# Patient Record
Sex: Male | Born: 1945 | Race: White | Hispanic: No | Marital: Married | Smoking: Never smoker
Health system: Southern US, Community
[De-identification: ages and names within clinical notes are randomized; demographics above are authoritative.]

## PROBLEM LIST (undated history)

## (undated) DIAGNOSIS — R0600 Dyspnea, unspecified: Secondary | ICD-10-CM

## (undated) DIAGNOSIS — C61 Malignant neoplasm of prostate: Secondary | ICD-10-CM

## (undated) DIAGNOSIS — G2581 Restless legs syndrome: Secondary | ICD-10-CM

## (undated) DIAGNOSIS — K219 Gastro-esophageal reflux disease without esophagitis: Secondary | ICD-10-CM

## (undated) DIAGNOSIS — E119 Type 2 diabetes mellitus without complications: Secondary | ICD-10-CM

## (undated) DIAGNOSIS — M1990 Unspecified osteoarthritis, unspecified site: Secondary | ICD-10-CM

## (undated) HISTORY — PX: HERNIA REPAIR: SHX51

## (undated) HISTORY — PX: PROSTATE BIOPSY: SHX241

## (undated) HISTORY — PX: COLONOSCOPY: SHX174

---

## 2015-11-25 ENCOUNTER — Telehealth: Payer: Self-pay | Admitting: Medical Oncology

## 2015-11-27 NOTE — Telephone Encounter (Signed)
Oncology Nurse Navigator Documentation  Oncology Nurse Navigator Flowsheets 11/27/2015  Referral date to RadOnc/MedOnc 11/22/2015  Navigator Encounter Type Introductory phone call- Left a message requesting a return call to discuss appointment for Prostate [facility].

## 2015-12-10 ENCOUNTER — Ambulatory Visit: Payer: Self-pay | Admitting: Radiation Oncology

## 2015-12-10 ENCOUNTER — Ambulatory Visit: Payer: Self-pay | Admitting: Oncology

## 2015-12-19 ENCOUNTER — Encounter: Payer: Self-pay | Admitting: Medical Oncology

## 2015-12-19 ENCOUNTER — Telehealth: Payer: Self-pay | Admitting: Medical Oncology

## 2015-12-19 NOTE — Progress Notes (Signed)
Oncology Nurse Navigator Documentation  Oncology Nurse Navigator Flowsheets 11/27/2015 12/19/2015  Navigator Encounter Type Introductory phone call Telephone- I called [facility],spoke with [place] request  Pathology slides for Prostate [facility] 12/17/15.  Telephone - Outgoing Call  Barriers/Navigation Needs - Coordination of Care  Interventions - Other;Coordination of Care

## 2015-12-19 NOTE — Telephone Encounter (Signed)
error 

## 2015-12-19 NOTE — Telephone Encounter (Signed)
Oncology Nurse Navigator Documentation  Oncology Nurse Navigator Flowsheets 11/27/2015 12/10/2015 12/19/2015  Navigator Encounter Type Introductory phone call Telephone Telephone  Telephone - Appt Confirmation/Clarification Pt returned my call to confirm appointment. He will not be able to come 12/27 and requested a January appointment. He was given 12/27/15 arriving at 7:45am.   Outgoing Call  Barriers/Navigation Needs - - Coordination of Care  Interventions - - Other;Coordination of Care  Time Spent with Patient - 15 -   I called pt to introduce myself as the Prostate Nurse Navigator and the Coordinator of the Prostate [place].  1. I confirmed with the patient he is aware of his referral to the clinic 12/27/15.  2. I discussed the format of the clinic and the physicians he will be seeing that day.  3. I discussed where the clinic is located and how to contact me.  4. I confirmed his address and informed him I would be mailing a packet of information and forms to be completed. I asked him to bring them with him the day of his appointment.   He voiced understanding of the above. I asked him to call me if he has any questions or concerns regarding his appointments or the forms he needs to complete.

## 2015-12-27 ENCOUNTER — Ambulatory Visit: Payer: Self-pay | Admitting: Radiation Oncology

## 2015-12-27 ENCOUNTER — Ambulatory Visit: Payer: Self-pay | Admitting: Oncology

## 2016-01-01 ENCOUNTER — Encounter: Payer: Self-pay | Admitting: Adult Health

## 2016-01-01 DIAGNOSIS — C61 Malignant neoplasm of prostate: Secondary | ICD-10-CM | POA: Insufficient documentation

## 2016-01-06 ENCOUNTER — Encounter: Payer: Self-pay | Admitting: Radiation Oncology

## 2016-01-06 ENCOUNTER — Telehealth: Payer: Self-pay | Admitting: Medical Oncology

## 2016-01-06 NOTE — Progress Notes (Signed)
GU Location of Tumor / Histology: prostatic adenocarcinoma  If Prostate Cancer, Gleason Score is (3 + 3) and PSA is (5.54) on 10/29/2015  Alan Charles presented to Dr. Alinda Money for a second opinion regarding his elevated PSA and LUTS.  Biopsies of prostate (if applicable) revealed:    Past/Anticipated interventions by urology, if any: Patient was followed by Dr. Darcella Gasman, urologist, for elevated PSA from 2013 until August 2014 when a prostate biopsy was recommended. Patient elected not to have biopsy and had not been seen by a urologist until he saw Dr. Alinda Money on 09/06/2015. However, patient had a PSA drawn by  PCP November 2015 and PSA had risen. Prostate biopsy was performed by Dr. Alinda Money then, referral to [facility] was made.  Past/Anticipated interventions by medical oncology, if any: no  Weight changes, if any: no  Bowel/Bladder complaints, if any: incomplete emptying, frequency, urgency and nocturia.    Nausea/Vomiting, if any: no  Pain issues, if any:  no  SAFETY ISSUES:  Prior radiation? no  Pacemaker/ICD? no  Possible current pregnancy? no  Is the patient on methotrexate? no  Current Complaints / other details:  70 year old male. Married.

## 2016-01-06 NOTE — Telephone Encounter (Signed)
Oncology Nurse Navigator Documentation  Oncology Nurse Navigator Flowsheets 12/10/2015 12/19/2015 01/06/2016  Navigator Encounter Type Telephone Telephone Telephone  Telephone Appt Confirmation/Clarification Outgoing Call Outgoing Call;Appt Confirmation/Clarification- Spoke with Alan Charles to confirm his appointment for 01/07/16 arriving at 12:30pm.I reminded him to bring his completed medical forms and to eat lunch before his arrival to the clinic. We reviewed the location of the clinic and the [place] parking. He asked me how much money he will have to pay. I explained I do not know anything about the cost but it will be filed with his insurance. I asked if he would like for me to have some one contact him from billing and he states no. All questions were answered and he voiced understanding of the above.  Abnormal Finding Date - - 10/29/2015  Confirmed Diagnosis Date - - 11/14/2015  Barriers/Navigation Needs - Coordination of Care No barriers at this time  Interventions - Other;Coordination of Care -  Acuity - - Level 1  Acuity Level 1 - - Initial guidance, education and coordination as needed  Time Spent with Patient 15 - 15

## 2016-01-07 ENCOUNTER — Ambulatory Visit: Payer: Self-pay | Admitting: Oncology

## 2016-01-07 ENCOUNTER — Ambulatory Visit: Payer: Medicare Other | Attending: Radiation Oncology | Admitting: Radiation Oncology

## 2016-01-07 ENCOUNTER — Ambulatory Visit: Payer: Self-pay | Admitting: Radiation Oncology

## 2016-01-07 ENCOUNTER — Inpatient Hospital Stay
Admission: RE | Admit: 2016-01-07 | Discharge: 2016-01-07 | Disposition: A | Discharge status: Home / Self Care | Payer: Self-pay | Source: Ambulatory Visit | Attending: Radiation Oncology | Admitting: Radiation Oncology

## 2016-01-07 ENCOUNTER — Ambulatory Visit
Admission: RE | Admit: 2016-01-07 | Discharge: 2016-01-07 | Disposition: A | Discharge status: Home / Self Care | Payer: Self-pay | Source: Ambulatory Visit | Attending: Radiation Oncology | Admitting: Radiation Oncology

## 2016-01-07 ENCOUNTER — Ambulatory Visit
Admission: RE | Admit: 2016-01-07 | Discharge: 2016-01-07 | Disposition: A | Discharge status: Home / Self Care | Payer: Medicare Other | Source: Ambulatory Visit | Attending: Radiation Oncology | Admitting: Radiation Oncology

## 2016-01-07 HISTORY — DX: Malignant neoplasm of prostate: C61

## 2016-01-07 HISTORY — DX: Restless legs syndrome: G25.81

## 2016-01-07 HISTORY — DX: Gastro-esophageal reflux disease without esophagitis: K21.9

## 2016-01-07 HISTORY — DX: Unspecified osteoarthritis, unspecified site: M19.90

## 2016-02-11 ENCOUNTER — Ambulatory Visit: Payer: Medicare Other | Admitting: Radiation Oncology

## 2016-03-19 DIAGNOSIS — R6889 Other general symptoms and signs: Secondary | ICD-10-CM | POA: Insufficient documentation

## 2016-03-19 DIAGNOSIS — R0602 Shortness of breath: Secondary | ICD-10-CM | POA: Insufficient documentation

## 2016-04-15 ENCOUNTER — Other Ambulatory Visit (HOSPITAL_COMMUNITY): Payer: Self-pay | Admitting: Urology

## 2016-04-15 DIAGNOSIS — C61 Malignant neoplasm of prostate: Secondary | ICD-10-CM

## 2016-06-09 ENCOUNTER — Other Ambulatory Visit (HOSPITAL_COMMUNITY): Payer: Self-pay | Admitting: Urology

## 2016-06-09 ENCOUNTER — Ambulatory Visit (HOSPITAL_COMMUNITY): Admission: RE | Admit: 2016-06-09 | Payer: Medicare Other | Source: Ambulatory Visit

## 2016-06-09 ENCOUNTER — Ambulatory Visit (HOSPITAL_COMMUNITY)
Admission: RE | Admit: 2016-06-09 | Discharge: 2016-06-09 | Disposition: A | Discharge status: Home / Self Care | Payer: Medicare Other | Source: Ambulatory Visit | Attending: Urology | Admitting: Urology

## 2016-06-09 DIAGNOSIS — C61 Malignant neoplasm of prostate: Secondary | ICD-10-CM

## 2016-06-11 ENCOUNTER — Ambulatory Visit
Admission: RE | Admit: 2016-06-11 | Discharge: 2016-06-11 | Disposition: A | Discharge status: Home / Self Care | Payer: Medicare Other | Source: Ambulatory Visit | Attending: Urology | Admitting: Urology

## 2016-06-11 DIAGNOSIS — C61 Malignant neoplasm of prostate: Secondary | ICD-10-CM

## 2016-06-11 MED ORDER — GADOBENATE DIMEGLUMINE 529 MG/ML IV SOLN
20.0000 mL | Freq: Once | INTRAVENOUS | Status: AC | PRN
  Administered 2016-06-11: 20 mL via INTRAVENOUS

## 2018-01-09 IMAGING — MR MR PROSTATE WO/W CM
49 series · 49 of 49 positions shown · IV contrast (Multihance 20cc)
Comparison: None.

CLINICAL DATA: Prostate carcinoma. Positive biopsy in the LEFT
thigh apex with Gleason score 3+ 3 equals 6. Positive biopsy in the
RIGHT apex Gleason 3+ 3 equals 6.

EXAM:
MR PROSTATE WITHOUT AND WITH CONTRAST
TECHNIQUE: Multiplanar multisequence MRI images were obtained of the pelvis
centered about the prostate. Pre and post contrast images were
obtained. No endo rectal coil deployment due to patient latex
allergy.
CONTRAST:  20mL MULTIHANCE GADOBENATE DIMEGLUMINE 529 MG/ML IV SOLN

[Series 3: T1 · axial · 8.0mm · 1.06mm/px · 1 of 28 slices shown (1 of 2)]
[im 1/28]
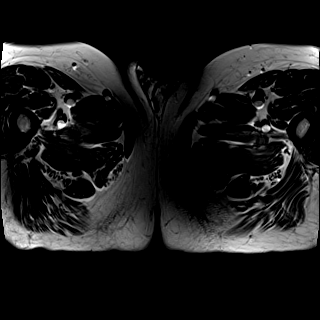

[Series 4: T2 · sagittal · 3.5mm · 0.56mm/px · 1 of 20 slices shown (1 of 3)]
[im 1/20]
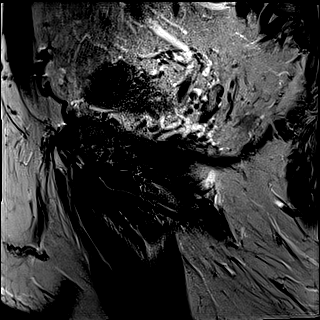

[Series 5: bSSFP fat-sat · axial · 5.0mm · 0.74mm/px · 1 of 40 slices shown]
[im 1/40]
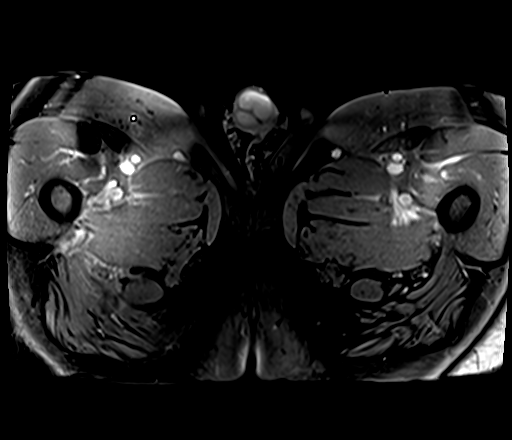

[Series 6: T1 · axial · 3.0mm · 0.31mm/px · 1 of 24 slices shown (2 of 2)]
[im 1/24]
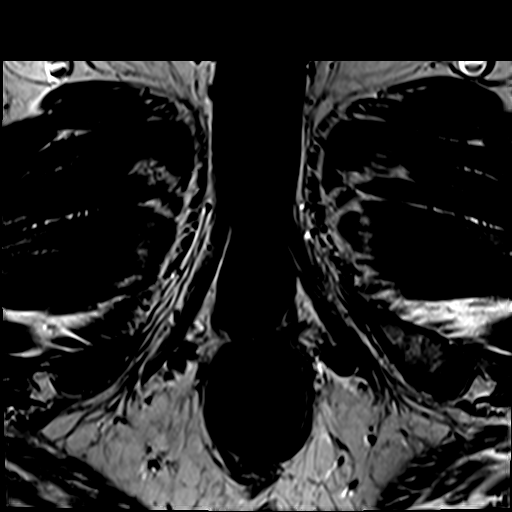

[Series 7: T2 · axial · 3.5mm · 0.56mm/px · 1 of 20 slices shown (2 of 3)]
[im 1/20]
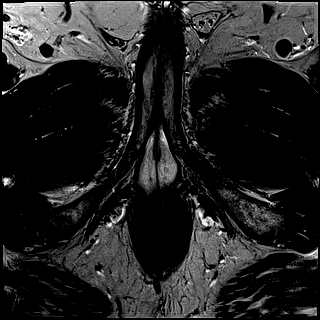

[Series 8: T2 · coronal · 3.5mm · 0.56mm/px · 1 of 20 slices shown (3 of 3)]
[im 1/20]
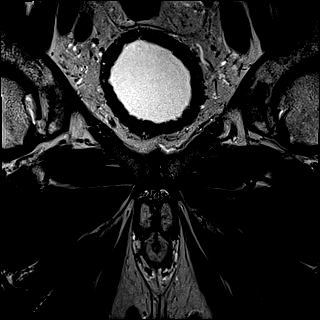

[Series 9: DWI · axial · 3.5mm · 1.72mm/px · 1 of 59 slices shown (1 of 2)]
[im 1/59]
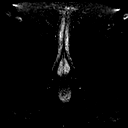

[Series 10: DWI · axial · 3.5mm · 1.72mm/px · 1 of 20 slices shown (2 of 2)]
[im 1/20]
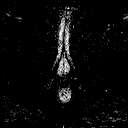

[Series 11: t1_twist_tra_dyn_ttc=5.3s · axial · 3.5mm · 0.83mm/px · 1 of 20 slices shown]
[im 1/20]
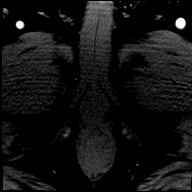

[Series 12: t1_twist_tra_dyn_ttc=12.8s · axial · 3.5mm · 0.83mm/px · 1 of 20 slices shown]
[im 1/20]
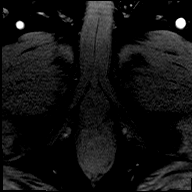

[Series 13: t1_twist_tra_dyn_ttc=23.0s · axial · 3.5mm · 0.83mm/px · 1 of 20 slices shown]
[im 1/20]
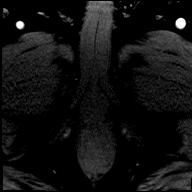

[Series 14: t1_twist_tra_dyn_ttc=33.2s · axial · 3.5mm · 0.83mm/px · 1 of 20 slices shown]
[im 1/20]
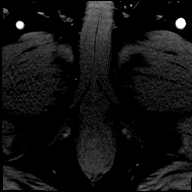

[Series 15: t1_twist_tra_dyn_ttc=43.4s · axial · 3.5mm · 0.83mm/px · 1 of 20 slices shown]
[im 1/20]
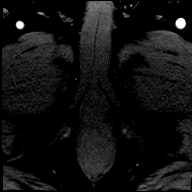

[Series 16: t1_twist_tra_dyn_ttc=53.6s · axial · 3.5mm · 0.83mm/px · 1 of 20 slices shown]
[im 1/20]
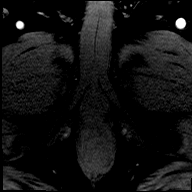

[Series 17: t1_twist_tra_dyn_ttc=63.8s · axial · 3.5mm · 0.83mm/px · 1 of 20 slices shown]
[im 1/20]
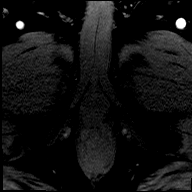

[Series 18: t1_twist_tra_dyn_ttc=74.0s · axial · 3.5mm · 0.83mm/px · 1 of 20 slices shown]
[im 1/20]
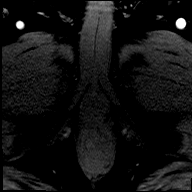

[Series 19: t1_twist_tra_dyn_ttc=84.2s · axial · 3.5mm · 0.83mm/px · 1 of 20 slices shown]
[im 1/20]
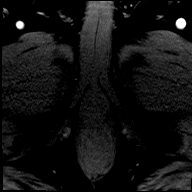

[Series 20: t1_twist_tra_dyn_ttc=94.4s · axial · 3.5mm · 0.83mm/px · 1 of 20 slices shown]
[im 1/20]
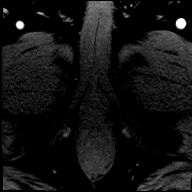

[Series 21: t1_twist_tra_dyn_ttc=104.7s · axial · 3.5mm · 0.83mm/px · 1 of 20 slices shown]
[im 1/20]
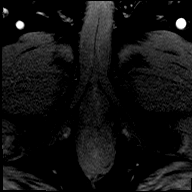

[Series 22: t1_twist_tra_dyn_ttc=114.9s · axial · 3.5mm · 0.83mm/px · 1 of 20 slices shown]
[im 1/20]
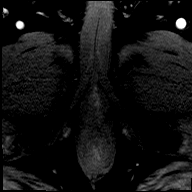

[Series 23: t1_twist_tra_dyn_ttc=125.1s · axial · 3.5mm · 0.83mm/px · 1 of 20 slices shown]
[im 1/20]
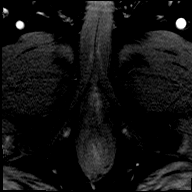

[Series 24: t1_twist_tra_dyn_ttc=135.3s · axial · 3.5mm · 0.83mm/px · 1 of 20 slices shown]
[im 1/20]
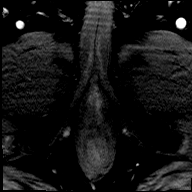

[Series 25: t1_twist_tra_dyn_ttc=145.5s · axial · 3.5mm · 0.83mm/px · 1 of 20 slices shown]
[im 1/20]
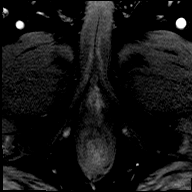

[Series 26: t1_twist_tra_dyn_ttc=155.7s · axial · 3.5mm · 0.83mm/px · 1 of 20 slices shown]
[im 1/20]
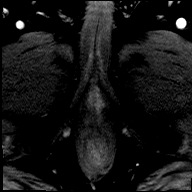

[Series 27: t1_twist_tra_dyn_ttc=165.9s · axial · 3.5mm · 0.83mm/px · 1 of 20 slices shown]
[im 1/20]
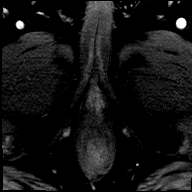

[Series 28: t1_twist_tra_dyn_ttc=176.1s · axial · 3.5mm · 0.83mm/px · 1 of 20 slices shown]
[im 1/20]
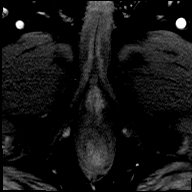

[Series 29: t1_twist_tra_dyn_ttc=186.3s · axial · 3.5mm · 0.83mm/px · 1 of 20 slices shown]
[im 1/20]
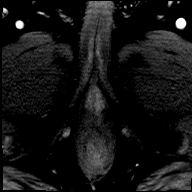

[Series 30: t1_twist_tra_dyn_ttc=196.5s · axial · 3.5mm · 0.83mm/px · 1 of 20 slices shown]
[im 1/20]
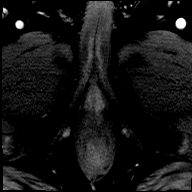

[Series 31: t1_twist_tra_dyn_ttc=206.7s · axial · 3.5mm · 0.83mm/px · 1 of 20 slices shown]
[im 1/20]
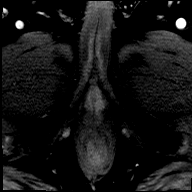

[Series 32: t1_twist_tra_dyn_ttc=216.9s · axial · 3.5mm · 0.83mm/px · 1 of 20 slices shown]
[im 1/20]
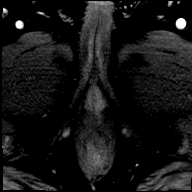

[Series 33: t1_twist_tra_dyn_ttc=227.1s · axial · 3.5mm · 0.83mm/px · 1 of 20 slices shown]
[im 1/20]
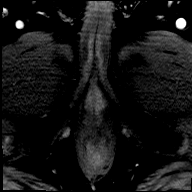

[Series 34: t1_twist_tra_dyn_ttc=237.3s · axial · 3.5mm · 0.83mm/px · 1 of 20 slices shown]
[im 1/20]
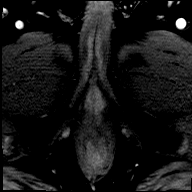

[Series 35: t1_twist_tra_dyn_ttc=247.5s · axial · 3.5mm · 0.83mm/px · 1 of 20 slices shown]
[im 1/20]
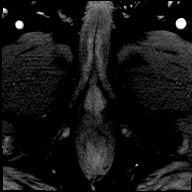

[Series 36: t1_twist_tra_dyn_ttc=257.7s · axial · 3.5mm · 0.83mm/px · 1 of 20 slices shown]
[im 1/20]
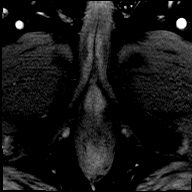

[Series 37: t1_twist_tra_dyn_ttc=267.9s · axial · 3.5mm · 0.83mm/px · 1 of 20 slices shown]
[im 1/20]
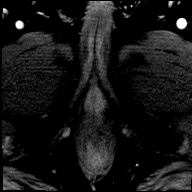

[Series 38: t1_twist_tra_dyn_ttc=278.1s · axial · 3.5mm · 0.83mm/px · 1 of 20 slices shown]
[im 1/20]
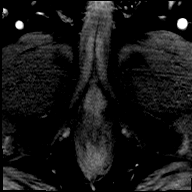

[Series 39: t1_twist_tra_dyn_ttc=288.3s · axial · 3.5mm · 0.83mm/px · 1 of 20 slices shown]
[im 1/20]
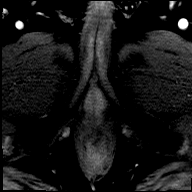

[Series 40: t1_twist_tra_dyn_ttc=298.5s · axial · 3.5mm · 0.83mm/px · 1 of 20 slices shown]
[im 1/20]
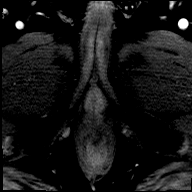

[Series 41: t1_twist_tra_dyn_ttc=308.7s · axial · 3.5mm · 0.83mm/px · 1 of 20 slices shown]
[im 1/20]
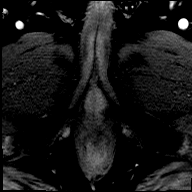

[Series 42: t1_twist_tra_dyn_ttc=318.9s · axial · 3.5mm · 0.83mm/px · 1 of 20 slices shown]
[im 1/20]
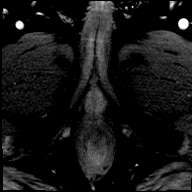

[Series 43: t1_twist_tra_dyn_ttc=329.1s · axial · 3.5mm · 0.83mm/px · 1 of 20 slices shown]
[im 1/20]
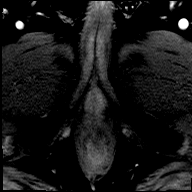

[Series 44: t1_twist_tra_dyn_ttc=339.3s · axial · 3.5mm · 0.83mm/px · 1 of 20 slices shown]
[im 1/20]
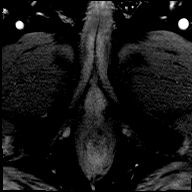

[Series 45: t1_twist_tra_dyn_ttc=349.5s · axial · 3.5mm · 0.83mm/px · 1 of 20 slices shown]
[im 1/20]
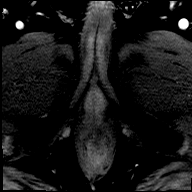

[Series 46: t1_twist_tra_dyn_ttc=359.7s · axial · 3.5mm · 0.83mm/px · 1 of 20 slices shown]
[im 1/20]
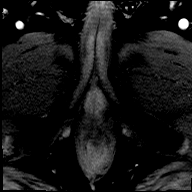

[Series 47: t1_twist_tra_dyn_ttc=369.9s · axial · 3.5mm · 0.83mm/px · 1 of 20 slices shown]
[im 1/20]
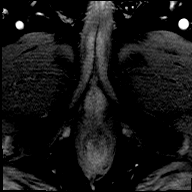

[Series 48: t1_twist_tra_dyn_ttc=380.1s · axial · 3.5mm · 0.83mm/px · 1 of 20 slices shown]
[im 1/20]
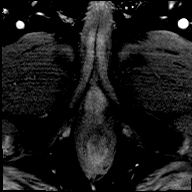

[Series 49: t1_twist_tra_dyn_ttc=390.4s · axial · 3.5mm · 0.83mm/px · 1 of 20 slices shown]
[im 1/20]
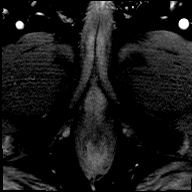

[Series 50: t1_twist_tra_dyn_ttc=400.6s · axial · 3.5mm · 0.83mm/px · 1 of 20 slices shown]
[im 1/20]
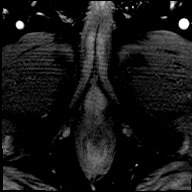

[Series 51: t1_twist_tra_dyn_ttc=410.8s · axial · 3.5mm · 0.83mm/px · 1 of 20 slices shown]
[im 1/20]
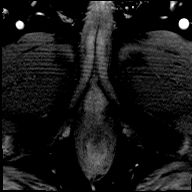

[49 of 49 positions shown; findings below may reference images not displayed]

FINDINGS: Prostate: CT there is no restricted diffusion noted within the
peripheral zone. On the T2 weighted sequence, there is no discrete
lesion within the T2 peripheral zone. There is linear hypo
intensities consistent with stromal tissue.

No abnormal enhancement in the peripheral zone.

The transitional zone is nodular with well encapsulated nodules. One
nodule is extruded midline adjacent to the RIGHT seminal vesicle.

The prostatic capsule is intact. The neurovascular bundles are
normal. Seminal vesicles appear normal.

Transcapsular spread:  Absent

Seminal vesicle involvement: Absent

Neurovascular bundle involvement: Absent

Pelvic adenopathy: Absent

Bone metastasis: Absent

Other findings: Prostate measures 5.2 x 3.8 x 4 .3 cm for an
estimated volume of 44 gm
IMPRESSION: 1. No evidence of high-grade carcinoma in the peripheral zone.
2. Nodular transitional zone.
3. No evidence of transscapular spread or neurovascular bundle
involvement.
4. Estimated gland volume equals 44 gm.

## 2018-09-12 ENCOUNTER — Encounter: Payer: Self-pay | Admitting: Medical Oncology

## 2018-09-12 NOTE — Progress Notes (Signed)
I called pt to introduce myself as the Prostate Nurse Navigator and the Coordinator of the Prostate [place].  1. I confirmed with the patient he is aware of his referral to the clinic 09/23/18 arriving at 8 am.  2. I discussed the format of the clinic and the physicians he will be seeing that day.  3. I discussed where the clinic is located and how to contact me.  4. I confirmed his address and informed him I would be mailing a packet of information and forms to be completed. I asked him to bring them with him the day of his appointment.   He voiced understanding of the above. I asked him to call me if he has any questions or concerns regarding his appointments or the forms he needs to complete.

## 2018-09-19 ENCOUNTER — Encounter: Payer: Self-pay | Admitting: Medical Oncology

## 2018-09-20 NOTE — Progress Notes (Signed)
GU Location of Tumor / Histology: prostatic adenocarcinoma  If Prostate Cancer, Gleason Score is (3 + 3) and PSA is (5.54) in 11/2015. Prostate volume: 34.3 cc at diagnosis.   Prostate volume on 08/25/2018: 44.3 grams  Alan Charles was initially seen by Dr. Alinda Money in 2016 for a second opinion. His baseline PSA was 3.70 in 2013 and increased spuriously to 7.07 in 2014 prompting an initial urologic referral. Biopsy was recommended but patient refused. In November 2016 he developed symptoms of prostatitis and after antibiotics PSA was 5.54. He eventually did undergo a biopsy in 11/2015.  Biopsies of prostate (if applicable) revealed:    Past/Anticipated interventions by urology, if any: prostate biopsy, referral to [facility]  Past/Anticipated interventions by medical oncology, if any: no  Weight changes, if any: no  Bowel/Bladder complaints, if any: Reports frequency, urgency and frequent urge incontinence. IPSS 12. SHIM 5.   Nausea/Vomiting, if any: no  Pain issues, if any:  no  SAFETY ISSUES:  Prior radiation? no  Pacemaker/ICD? no  Possible current pregnancy? no  Is the patient on methotrexate? no  Current Complaints / other details:  72 year old male. Married. Never smoker.

## 2018-09-22 ENCOUNTER — Telehealth: Payer: Self-pay | Admitting: Medical Oncology

## 2018-09-22 ENCOUNTER — Encounter: Payer: Self-pay | Admitting: Genetics

## 2018-09-22 NOTE — Telephone Encounter (Signed)
Spoke with Alan Charles to confirm appointment for [facility] 09/23/18 arriving at 8:00 am. I reviewed [place] parking, registration and reminded them to bring completed medical forms. She states that they are coming from [place] and is worried about morning traffic. I told her to be safe if and if they are late we will work with them. She voiced understanding.

## 2018-09-23 ENCOUNTER — Encounter: Payer: Self-pay | Admitting: Medical Oncology

## 2018-09-23 ENCOUNTER — Encounter: Payer: Self-pay | Admitting: General Practice

## 2018-09-23 ENCOUNTER — Encounter: Payer: Self-pay | Admitting: Radiation Oncology

## 2018-09-23 ENCOUNTER — Ambulatory Visit
Admission: RE | Admit: 2018-09-23 | Discharge: 2018-09-23 | Disposition: A | Discharge status: Home / Self Care | Payer: Medicare Other | Source: Ambulatory Visit | Attending: Radiation Oncology | Admitting: Radiation Oncology

## 2018-09-23 VITALS — BP 133/85 | HR 74 | Temp 98.7°F | Resp 20 | Ht 70.0 in | Wt 294.0 lb

## 2018-09-23 DIAGNOSIS — M1990 Unspecified osteoarthritis, unspecified site: Secondary | ICD-10-CM | POA: Diagnosis not present

## 2018-09-23 DIAGNOSIS — C61 Malignant neoplasm of prostate: Secondary | ICD-10-CM

## 2018-09-23 DIAGNOSIS — G2581 Restless legs syndrome: Secondary | ICD-10-CM | POA: Diagnosis not present

## 2018-09-23 DIAGNOSIS — Z79899 Other long term (current) drug therapy: Secondary | ICD-10-CM | POA: Diagnosis not present

## 2018-09-23 DIAGNOSIS — Z7982 Long term (current) use of aspirin: Secondary | ICD-10-CM | POA: Insufficient documentation

## 2018-09-23 DIAGNOSIS — E119 Type 2 diabetes mellitus without complications: Secondary | ICD-10-CM | POA: Insufficient documentation

## 2018-09-23 NOTE — Progress Notes (Signed)
[place] Psychosocial Distress Screening Spiritual Care  Met with Alan Charles and his wife Alan Charles in [facility] to introduce [place] team/resources, reviewing distress screen per protocol.  The patient scored a 0 on the Psychosocial Distress Thermometer which indicates minimal distress. Also assessed for distress and other psychosocial needs.   ON[facility] DISTRESS SCREENING 09/23/2018  Screening Type Initial Screening  Distress experienced in past week (1-10) 0  Emotional problem type Adjusting to illness  Information Concerns Type Lack of info about diagnosis  Physical Problem type Sleep/insomnia;Swollen arms/legs   Per pt, his biggest stressor isn't dx, but loss of church home of 40 years due to pastoral conflict. His 23 faith is very important to him, and he grieves not having a spiritual community especially as he faces a new diagnosis and would benefit from connection and encouragement. Couple was very receptive to chaplain support, engaged readily in conversation and pastoral reflection, and knows to contact chaplain/Support Team whenever desired.  Follow up needed: No. Per couple, no other needs at this time, but please page if needs arise or circumstances change. Thank you.   [place], [place], [facility] Pager [phone] Voicemail [phone]

## 2018-09-23 NOTE — Progress Notes (Signed)
Radiation Oncology         [phone] ________________________________  Multidisciplinary Prostate Cancer Clinic  Initial outpatient Consultation  Name: Alan Charles MRN: [id number]  Date: 09/23/2018  DOB: 13-Feb-1946  CC:No primary care provider on file.  Raynelle Bring, MD   REFERRING PHYSICIAN: Raynelle Bring, MD  DIAGNOSIS: 72 year-old gentleman with Stage T1c adenocarcinoma of the prostate with Gleason Score of 3+4, and PSA of 12.10.    ICD-10-CM   1. Malignant neoplasm of prostate ([place]) C61   2. Restless leg syndrome G25.81     HISTORY OF PRESENT ILLNESS: Alan Charles is a 72 y.o. male with a diagnosis of prostate cancer. He was initially seen by Dr. Alinda Money in 2016 for a second opinion. His baseline PSA was 3.70 in 2013 and increased spuriously to 7.07 in 2014 prompting an initial urologic referral. Biopsy was recommended but patient refused. In November 2016, he developed symptoms of prostatitis and after antibiotics his PSA was 5.54. He eventually did undergo a biopsy on 11/14/2015 which showed Gleason 3+3 disease in 2 of 12 cores.  He elected to proceed with active surveillance.  As part of his active surveillance, he underwent a prostate MRI in June 2017 which was negative for any suspicious lesions.  He had a repeat prostate biopsy in July 2017 with findings of stable Gleason 3+3 disease in 2 cores only.    His digital rectal exam remained normal throughout.  In January 2018 his PSA was noted to be 6.38 and had significantly increased when repeated on August 05, 2018 with PSA of 12.10.  Therefore, he proceeded with a repeat transrectal ultrasound with 12 biopsies of the prostate on 08/25/2018.  The prostate volume measured 44.3 cc.  Out of 12 core biopsies, 4 were positive.  The maximum Gleason score was 3+4, and this was seen in left mid and right apex.  Additionally, there was Gleason 3+3 disease seen in the left mid lateral and left apex.  The patient reviewed the  biopsy results with his urologist and he has kindly been referred today to the multidisciplinary prostate cancer clinic for presentation of pathology and radiology studies in our conference for discussion of potential radiation treatment options and clinical evaluation.     PREVIOUS RADIATION THERAPY: No  PAST MEDICAL HISTORY:  Past Medical History:  Diagnosis Date  . Arthritis   . GERD (gastroesophageal reflux disease)   . Prostate cancer ([place])   . Restless leg   . Sleep apnea       PAST SURGICAL HISTORY: Past Surgical History:  Procedure Laterality Date  . HERNIA REPAIR    . PROSTATE BIOPSY      FAMILY HISTORY:  Family History  Problem Relation Age of Onset  . Diabetes Mother   . Kidney disease Mother   . Parkinson's disease Father   . Pancreatic cancer Neg Hx   . Colon cancer Neg Hx   . Prostate cancer Neg Hx   . Breast cancer Neg Hx     SOCIAL HISTORY:  Social History   Socioeconomic History  . Marital status: Married    Spouse name: Zigmund Daniel  . Number of children: 1  . Years of education: Not on file  . Highest education level: Not on file  Occupational History  . Occupation: Retired    Comment: Firefighter, [employer]  Social Needs  . Financial resource strain: Not on file  . Food insecurity:    Worry: Not on file  Inability: Not on file  . Transportation needs:    Medical: Not on file    Non-medical: Not on file  Tobacco Use  . Smoking status: Never Smoker  . Smokeless tobacco: Never Used  Substance and Sexual Activity  . Alcohol use: No  . Drug use: No  . Sexual activity: Not Currently  Lifestyle  . Physical activity:    Days per week: Not on file    Minutes per session: Not on file  . Stress: Not on file  Relationships  . Social connections:    Talks on phone: Not on file    Gets together: Not on file    Attends religious service: Not on file    Active member of club or organization: Not on file    Attends meetings of clubs or  organizations: Not on file    Relationship status: Not on file  . Intimate partner violence:    Fear of current or ex partner: Not on file    Emotionally abused: Not on file    Physically abused: Not on file    Forced sexual activity: Not on file  Other Topics Concern  . Not on file  Social History Narrative  . Not on file    ALLERGIES: Latex  MEDICATIONS:  Current Outpatient Medications  Medication Sig Dispense Refill  . ACCU-CHEK FASTCLIX LANCETS MISC USE QID  4  . ACCU-CHEK GUIDE test strip U QID UTD  12  . Empagliflozin (JARDIANCE PO) Take by mouth.    Marland Kitchen rOPINIRole (REQUIP) 1 MG tablet Take 1 mg by mouth 3 (three) times daily.    . tamsulosin (FLOMAX) 0.4 MG CAPS capsule Take 0.4 mg by mouth.    . venlafaxine XR (EFFEXOR-XR) 75 MG 24 hr capsule Take 225 mg by mouth daily.  0  . aspirin 81 MG tablet Take 81 mg by mouth daily.    Marland Kitchen ketoconazole (NIZORAL) 2 % cream apply twice a day for 2 TO 4 WEEKS    . LACTOBACILLUS PO Take by mouth.    . TRULICITY 1.5 ZH/2.9JM SOPN ADM 0.5 ML [place] Q 7 DAYS  10   No current facility-administered medications for this encounter.     REVIEW OF SYSTEMS:  On review of systems, the patient reports that he is doing well overall. He denies any chest pain, shortness of breath, cough, fevers, chills, night sweats, unintended weight changes. He denies any bowel disturbances, and denies abdominal pain, nausea or vomiting. He denies any new musculoskeletal or joint aches or pains. His IPSS was 12, indicating moderate urinary symptoms. He is not able to complete sexual activity. A complete review of systems is obtained and is otherwise negative.    PHYSICAL EXAM:  Wt Readings from Last 3 Encounters:  09/23/18 294 lb (133.4 kg)  06/11/16 257 lb (116.6 kg)   Temp Readings from Last 3 Encounters:  09/23/18 98.7 F (37.1 C) (Oral)   BP Readings from Last 3 Encounters:  09/23/18 133/85   Pulse Readings from Last 3 Encounters:  09/23/18 74   Pain  Assessment Pain Score: 0-No pain/10  In general this is a well appearing Caucasian male in no acute distress. He is alert and oriented x4 and appropriate throughout the examination. Patient is accompanied by his wife today. HEENT reveals that the patient is normocephalic, atraumatic. EOMs are intact. PERRLA. Skin is intact without any evidence of gross lesions. Cardiovascular exam reveals a regular rate and rhythm, no clicks rubs or murmurs are auscultated.  Chest is clear to auscultation bilaterally. Lymphatic assessment is performed and does not reveal any adenopathy in the cervical, supraclavicular, axillary, or inguinal chains. Abdomen has active bowel sounds in all quadrants and is intact. The abdomen is soft, non tender, non distended. Lower extremities are negative for pretibial pitting edema, deep calf tenderness, cyanosis or clubbing.   KPS = 100  100 - Normal; no complaints; no evidence of disease. 90   - Able to carry on normal activity; minor signs or symptoms of disease. 80   - Normal activity with effort; some signs or symptoms of disease. 30   - Cares for self; unable to carry on normal activity or to do active work. 60   - Requires occasional assistance, but is able to care for most of his personal needs. 50   - Requires considerable assistance and frequent medical care. 92   - Disabled; requires special care and assistance. 34   - Severely disabled; hospital admission is indicated although death not imminent. 70   - Very sick; hospital admission necessary; active supportive treatment necessary. 10   - Moribund; fatal processes progressing rapidly. 0     - Dead  Karnofsky DA, Abelmann WH, Craver LS and Burchenal JH [phone]) The use of the nitrogen mustards in the palliative treatment of carcinoma: with particular reference to bronchogenic carcinoma Cancer 1 634-56  LABORATORY DATA:  No results found for: WBC, HGB, HCT, MCV, PLT No results found for: NA, K, CL, CO2 No results  found for: ALT, AST, GGT, ALKPHOS, BILITOT   RADIOGRAPHY: No results found.    IMPRESSION/PLAN: 1. 72 y.o. gentleman with Stage T1c adenocarcinoma of the prostate with Gleason Score of 3+4, and PSA of 12.10.  We discussed the patient's workup and outlined the nature of prostate cancer in this setting. The patient's T stage, Gleason's score, and PSA put him into the favorable intermediate risk group. Accordingly, he is eligible for a variety of potential treatment options including brachytherapy, 5.5 - 8 weeks of external radiation or 5 weeks of external radiation followed by a brachytherapy boost. We discussed the available radiation techniques, and focused on the details and logistics and delivery. We discussed and outlined the risks, benefits, short and long-term effects associated with radiotherapy and compared and contrasted these with prostatectomy. We discussed the role of SpaceOAR in reducing the rectal toxicity associated with radiotherapy.   At the conclusion of our conversation, the patient remains undecided regarding his preferred treatment but appears to be leaning towards brachytherapy with SpaceOAR.  He reports that he is not interested in prostatectomy.  We will plan to follow-up with him in the next 1 to 2 weeks to ascertain his treatment preference and will move forward with treatment planning accordingly at that time.  We will share our findings with Dr. Alinda Money and keep him apprised of the patient's treatment preference once he has reached a decision.  He knows to call at anytime with any questions or concerns in the interim.    Nicholos Johns, PA-C    Tyler Pita, MD  [facility] Direct Dial: [phone]  Fax: [phone] [email or web address]  Skype  [email or web address]  This document serves as a record of services personally performed by Tyler Pita, MD and Freeman Caldron, PA-C. It was created on their behalf by Arlyce Harman, a trained medical scribe.  The creation of this record is based on the scribe's personal observations and the provider's statements to them. This document has been checked and  approved by the attending provider.

## 2018-09-23 NOTE — Consult Note (Signed)
[facility]     09/23/2018   --------------------------------------------------------------------------------   Alan Charles  MRN: [id number]  PRIMARY CARE:    DOB: 06-11-1946, 72 year old Male  REFERRING:  Derrek Monaco, M  SSN: [id number]  PROVIDER:  Raynelle Bring, M.D.    LOCATION:  [facility] [phone]   --------------------------------------------------------------------------------   CC/HPI: CC: Prostate Cancer   Location of consult: [facility]   [name] has a long history of an elevated PSA that had fluctuated between 3.7 and 7.07. His outside urologist had recommended a biopsy but he declined. He initially saw me in September 2016. His PSA was 5.54 and he eventually agreed to a prostate biopsy on 11/14/15 that indicated Gleason 3+3=6 adenocarcinoma in 2 out of 12 biopsy cores. He elected active surveillance management. He underwent a prostate MRI in June 2017 that was unremarkable and a confirmatory 26 core biopsy in July 2017 demonstrated 2 out of 26 biopsy cores to be positive for Gleason 3+3=6 disease confirming his low risk disease diagnosis. In August 2019, his PSA increased to 12.10 and a surveillance biopsy on 08/25/18 demonstrated upgrade Gleason 3+4=7 adenocarcinoma with 4 out of 12 biopsy cores positive for malignancy.   He has significant LUTS that have been marginally improved on tamsulosin.   Family history: None.   Imaging studies: None.   PMH: He has a history of sleep apnea, GERD, restless leg syndrome.  PSH: Hernia repair.   TNM stage: cT1c Nx Mx  PSA: 12.10  Gleason score: 3+4=7  Biopsy (08/25/18): 4/12 cores positive  Left: L apex (10%, 3+3=6), L mid (10%, 3+4=7), L lateral mid (5%, 3+3=6)  Right: R apex (20%, 3+4=7)  Prostate volume: 44.3    Urinary function: IPSS is 12. He continues to take tamsulosin. He had previously been on oxybutynin without  significant benefit.  Erectile function: SHIM score is 5.     ALLERGIES: Latex No Allergies    MEDICATIONS: Oxybutynin Chloride Er 10 mg tablet, extended release 24 hr 1 tablet PO Daily  Aspirin 81 mg tablet,chewable Oral  Ropinirole Hcl 1 mg tablet Oral  Tamsulosin Hcl 0.4 mg capsule Oral  Venlafaxine Hcl Er 75 mg capsule, ext release 24 hr Oral     GU PSH: Prostate Needle Biopsy - 08/25/2018, 2017      PSH Notes: Hernia Repair   NON-GU PSH: Hernia Repair - 2016 Surgical Pathology, Gross And Microscopic Examination For Prostate Needle - 08/25/2018, 2017    GU PMH: Urinary Urgency - 12/29/2017 Urinary Frequency - 2018 Prostate Cancer, Prostate cancer - 2017 Hemorrhagic cystitis, Acute hemorrhagic cystitis - 2016 BPH w/LUTS, Benign prostatic hyperplasia (BPH) with straining on urination - 2016      PMH Notes:   1) Prostate cancer: He was initially seen by me in September 2016 for a 2nd opinion regarding his elevated PSA. His baseline PSA was 3.70 in 2013 and increased spuriously to 7.07 in 2014 prompting an initial urologic referral. His PSA decreased to 4.12 but then again increased to 4.80 in October 2014. He was recommended to have a biopsy by his urologist but he declined. His PSA further increased to 5.2 in 2015 and was 6.16 (9% free) when he initially saw me in September 2016. He shortly thereafter developed clinical symptoms of prostatitis and we agreed to treat him with antibiotics and repeat his PSA which was 5.54 in November 2016. My recommendation was to proceed with a  biopsy and he eventually did undergo a 12 core TRUS biopsy of the prostate on 11/14/15. This demonstrated Gleason 3+3=6 adenocarcinoma of the prostate with 2 out of 12 biopsy cores positive for malignancy. After a thorough discussion, he elected to proceed with active surveillance management.   He has no family history of prostate cancer.   Initial diagnosis: December 2016  TNM stage: cT1c Nx Mx  PSA: 5.54   Gleason score: 3+3=6  Biopsy (11/14/15): 2/12 cores positive - L lateral apex (30%, 3+3=6), R apex (20%, 3+3=6)  Prostate volume: 34.3 cc   Surveillance:  Jun 2017: MRI - negative  Jul 2017: 26 core extended biopsy - 2/26 cores positive - R apex (2/2 cores, 10%), Vol 28.5 cc   Baseline urinary function: IPSS is 17. He continues to take tamsulosin. His symptoms remain bothersome to a degree.  Baseline erectile function: SHIM score is 1.   2) BPH/LUTS: He had significant lower urinary tract symptoms at baseline including a sense of incomplete emptying, urgency, frequency, and nocturia. Baseline IPSS was 20 and he is significantly bothered by his symptoms despite alpha blocker therapy with tamsulosin.   Current treatment: Tamsulosin   Sep 2016: PVR 43, IPSS, 03 Dec 2015: Prostate volume 34.3 cc  Jan 2018: Trial of Myrbetriq (worsened symptoms)   NON-GU PMH: Restless legs syndrome, Restless leg - 2016 Arthritis Chronic fatigue, unspecified GERD Sleep Apnea    FAMILY HISTORY: Diabetes - Runs In Family Parkinson's Disease - Runs In Family renal failure - Runs In Family   SOCIAL HISTORY: Marital Status: Married Preferred Language: English; Ethnicity: Not Hispanic Or Latino; Race: White, Asian Drinks 1 caffeinated drink per day.     Notes: Married, Never a smoker, Alcohol use   REVIEW OF SYSTEMS:    GU Review Male:   Patient denies frequent urination, hard to postpone urination, burning/ pain with urination, get up at night to urinate, leakage of urine, stream starts and stops, trouble starting your streams, and have to strain to urinate .  Gastrointestinal (Lower):   Patient denies diarrhea and constipation.  Gastrointestinal (Upper):   Patient denies nausea and vomiting.  Constitutional:   Patient denies fever, night sweats, weight loss, and fatigue.  Skin:   Patient denies skin rash/ lesion and itching.  Eyes:   Patient denies blurred vision and double vision.  Ears/ Nose/  Throat:   Patient denies sore throat and sinus problems.  Hematologic/Lymphatic:   Patient denies swollen glands and easy bruising.  Cardiovascular:   Patient denies leg swelling and chest pains.  Respiratory:   Patient denies cough and shortness of breath.  Endocrine:   Patient denies excessive thirst.  Musculoskeletal:   Patient denies back pain and joint pain.  Neurological:   Patient denies headaches and dizziness.  Psychologic:   Patient denies depression and anxiety.   VITAL SIGNS: None   MULTI-SYSTEM PHYSICAL EXAMINATION:    Constitutional: Well-nourished. No physical deformities. Normally developed. Good grooming.     PAST DATA REVIEWED:  Source Of History:  Patient  Lab Test Review:   PSA  Records Review:   Pathology Reports  Urine Test Review:   Urinalysis   08/05/18 12/29/17 01/13/17 07/03/16 10/30/15 09/07/15  PSA  Total PSA 12.10 ng/mL 8.90 ng/mL 6.38 ng/dl 6.83  5.54  6.16   Free PSA     0.49  0.54   % Free PSA     9  9     PROCEDURES: None   ASSESSMENT:  ICD-10 Details  1 GU:   Prostate Cancer - C61    PLAN:           Document Letter(s):  Created for Patient: Clinical Summary         Notes:   1. Prostate cancer: I had a detailed discussion with Mr. Derossett and his wife. The patient was counseled about the natural history of prostate cancer and the standard treatment options that are available for prostate cancer. It was explained to him how his age and life expectancy, clinical stage, Gleason score, and PSA affect his prognosis, the decision to proceed with additional staging studies, as well as how that information influences recommended treatment strategies. We discussed the roles for active surveillance, radiation therapy, surgical therapy, androgen deprivation, as well as ablative therapy options for the treatment of prostate cancer as appropriate to his individual cancer situation. We discussed the risks and benefits of these options with regard to their  impact on cancer control and also in terms of potential adverse events, complications, and impact on quality of life particularly related to urinary and sexual function. The patient was encouraged to ask questions throughout the discussion today and all questions were answered to his stated satisfaction. In addition, the patient was provided with and/or directed to appropriate resources and literature for further education about prostate cancer and treatment options.   We discussed surgical therapy for prostate cancer including the different available surgical approaches. We discussed, in detail, the risks and expectations of surgery with regard to cancer control, urinary control, and erectile function as well as the expected postoperative recovery process. Additional risks of surgery including but not limited to bleeding, infection, hernia formation, nerve damage, lymphocele formation, bowel/rectal injury potentially necessitating colostomy, damage to the urinary tract resulting in urine leakage, urethral stricture, and the cardiopulmonary risks such as myocardial infarction, stroke, death, venothromboembolism, etc. were explained. The risk of open surgical conversion for robotic/laparoscopic prostatectomy was also discussed.   He is scheduled to see Dr. Tammi Klippel later this morning. He will give consideration to his options and notify me how he would like to proceed. If he does ultimately wish to consider surgical treatment, my plan would be to perform a bilateral nerve-sparing robot assisted laparoscopic radical prostatectomy and bilateral pelvic lymphadenectomy. I would have him return for a preoperative visit for a full exam prior to treatment.   CC: Dr. Tyler Pita    E & M CODE: I spent at least 58 minutes face to face with the patient, more than 50% of that time was spent on counseling and/or coordinating care.

## 2018-09-23 NOTE — Progress Notes (Signed)
                               Care Plan Summary  Name: Alan Charles DOB: 1946-11-01   Your Medical Team:   Urologist -  Dr. Raynelle Bring, [facility]  Radiation Oncologist - Dr. Tyler Pita, [place]    Recommendations: 1) Brachytherapy (seed implant)  2) Robotic prostatectomy   * These recommendations are based on information available as of today's consult.      Recommendations may change depending on the results of further tests or exams.  Next Steps: 1) Alan Charles with Dr. Johny Shears office will schedule appointments for seed implant.   When appointments need to be scheduled, you will be contacted by [facility] and/or [facility].  Patient received business cards for all team members and a copy of "[facility] Patient Safety Sheet".  Questions?  Please do not hesitate to call Cira Rue, RN, BSN, OCN at [phone] any questions or concerns.  Shirlean Mylar is your Oncology Nurse Navigator and is available to assist you while you're receiving your medical care at [facility].

## 2018-09-29 ENCOUNTER — Telehealth: Payer: Self-pay | Admitting: *Deleted

## 2018-09-29 NOTE — Telephone Encounter (Signed)
Called patient to inform of pre-seed planning CT on 10-21-18, spoke with patient and he is aware of this appt.

## 2018-10-06 ENCOUNTER — Telehealth: Payer: Self-pay | Admitting: Medical Oncology

## 2018-10-06 NOTE — Telephone Encounter (Signed)
Spoke with patient as follow up to [facility]. He states he is doing well and confirmed his appointment for CT sim 11/8.  All questions were answered and I asked him to call me if he has further concerns. He voiced understanding.

## 2018-10-18 ENCOUNTER — Telehealth: Payer: Self-pay | Admitting: *Deleted

## 2018-10-18 NOTE — Telephone Encounter (Signed)
CALLED PATIENT TO INFORM OF APPT. CHANGES FOR NOV. 8, SPOKE WITH PATIENT AND HE IS AWARE OF THESE CHANGES AND IS GOOD WITH THEM.

## 2018-10-20 ENCOUNTER — Telehealth: Payer: Self-pay | Admitting: *Deleted

## 2018-10-20 NOTE — Telephone Encounter (Signed)
Called patient to remind of pre-seed appts. for 10-21-18, spoke with patient and he is aware of these appts.

## 2018-10-21 ENCOUNTER — Ambulatory Visit
Admission: RE | Admit: 2018-10-21 | Discharge: 2018-10-21 | Disposition: A | Discharge status: Home / Self Care | Payer: Medicare Other | Source: Ambulatory Visit | Attending: Radiation Oncology | Admitting: Radiation Oncology

## 2018-10-21 DIAGNOSIS — C61 Malignant neoplasm of prostate: Secondary | ICD-10-CM | POA: Insufficient documentation

## 2018-10-21 NOTE — Progress Notes (Signed)
  Radiation Oncology         [phone] ________________________________  Name: Alan Charles MRN: [id number]  Date: 10/21/2018  DOB: 18-Feb-1946  SIMULATION AND TREATMENT PLANNING NOTE PUBIC ARCH STUDY  [id number], Epifanio Lesches, MD  Raynelle Bring, MD  DIAGNOSIS: 72 year-old gentleman with Stage T1c adenocarcinoma of the prostate with Gleason Score of 3+4, and PSA of 12.10.     ICD-10-CM   1. Malignant neoplasm of prostate ([place]) C61     COMPLEX SIMULATION:  The patient presented today for evaluation for possible prostate seed implant. He was brought to the radiation planning suite and placed supine on the CT couch. A 3-dimensional image study set was obtained in upload to the planning computer. There, on each axial slice, I contoured the prostate gland. Then, using three-dimensional radiation planning tools I reconstructed the prostate in view of the structures from the transperineal needle pathway to assess for possible pubic arch interference. In doing so, I did not appreciate any pubic arch interference. Also, the patient's prostate volume was estimated based on the drawn structure. The volume was 44 cc.  Given the pubic arch appearance and prostate volume, patient remains a good candidate to proceed with prostate seed implant. Today, he freely provided informed written consent to proceed.    PLAN: The patient will undergo prostate seed implant.   ________________________________  Sheral Apley. Tammi Klippel, M.D.  This document serves as a record of services personally performed by Tyler Pita, MD. It was created on his behalf by Arlyce Harman, a trained medical scribe. The creation of this record is based on the scribe's personal observations and the provider's statements to them. This document has been checked and approved by the attending provider.

## 2018-10-25 ENCOUNTER — Other Ambulatory Visit: Payer: Self-pay | Admitting: Urology

## 2018-10-26 ENCOUNTER — Telehealth: Payer: Self-pay | Admitting: *Deleted

## 2018-10-26 NOTE — Telephone Encounter (Signed)
CALLED PATIENT TO INFORM OF IMPLANT DATE, SPOKE WITH PATIENT AND HE IS AWARE OF THIS DATE 

## 2018-12-09 ENCOUNTER — Other Ambulatory Visit: Payer: Self-pay | Admitting: Urology

## 2018-12-09 DIAGNOSIS — C61 Malignant neoplasm of prostate: Secondary | ICD-10-CM

## 2018-12-23 NOTE — Patient Instructions (Signed)
Alan Charles  12/23/2018   Your procedure is scheduled on: 01/09/2009   Report to [facility] Main  Entrance  Report to admitting at    0530 AM    Call this number if you have problems the morning of surgery [phone]   Remember: Do not eat food or drink liquids :After Midnight. BRUSH YOUR TEETH MORNING OF SURGERY AND RINSE YOUR MOUTH OUT, NO CHEWING GUM CANDY OR MINTS.   FLEETS ENEMA AM OF SURGERY   Take these medicines the morning of surgery with A SIP OF WATER: Flomax, Effexor  DO NOT TAKE ANY DIABETIC MEDICATIONS DAY OF YOUR SURGERY                               You may not have any metal on your body including hair pins and              piercings  Do not wear jewelry, lotions, powders or perfumes, deodorant                          Men may shave face and neck.   Do not bring valuables to the hospital. [place].  Contacts, dentures or bridgework may not be worn into surgery.  Leave suitcase in the car. After surgery it may be brought to your room.     Patients discharged the day of surgery will not be allowed to drive home. IF YOU ARE HAVING SURGERY AND GOING HOME THE SAME DAY, YOU MUST HAVE AN ADULT TO DRIVE YOU HOME AND BE WITH YOU FOR 24 HOURS. YOU MAY GO HOME BY TAXI OR UBER OR ORTHERWISE, BUT AN ADULT MUST ACCOMPANY YOU HOME AND STAY WITH YOU FOR 24 HOURS.  Name and phone number of your driver:                Please read over the following fact sheets you were given: _____________________________________________________________________             [facility] - Preparing for Surgery Before surgery, you can play an important role.  Because skin is not sterile, your skin needs to be as free of germs as possible.  You can reduce the number of germs on your skin by washing with CHG (chlorahexidine gluconate) soap before surgery.  CHG is an antiseptic cleaner which kills germs and bonds with the  skin to continue killing germs even after washing. Please DO NOT use if you have an allergy to CHG or antibacterial soaps.  If your skin becomes reddened/irritated stop using the CHG and inform your nurse when you arrive at Short Stay. Do not shave (including legs and underarms) for at least 48 hours prior to the first CHG shower.  You may shave your face/neck. Please follow these instructions carefully:  1.  Shower with CHG Soap the night before surgery and the  morning of Surgery.  2.  If you choose to wash your hair, wash your hair first as usual with your  normal  shampoo.  3.  After you shampoo, rinse your hair and body thoroughly to remove the  shampoo.  4.  Use CHG as you would any other liquid soap.  You can apply chg directly  to the skin and wash                       Gently with a scrungie or clean washcloth.  5.  Apply the CHG Soap to your body ONLY FROM THE NECK DOWN.   Do not use on face/ open                           Wound or open sores. Avoid contact with eyes, ears mouth and genitals (private parts).                       Wash face,  Genitals (private parts) with your normal soap.             6.  Wash thoroughly, paying special attention to the area where your surgery  will be performed.  7.  Thoroughly rinse your body with warm water from the neck down.  8.  DO NOT shower/wash with your normal soap after using and rinsing off  the CHG Soap.                9.  Pat yourself dry with a clean towel.            10.  Wear clean pajamas.            11.  Place clean sheets on your bed the night of your first shower and do not  sleep with pets. Day of Surgery : Do not apply any lotions/deodorants the morning of surgery.  Please wear clean clothes to the hospital/surgery center.  FAILURE TO FOLLOW THESE INSTRUCTIONS MAY RESULT IN THE CANCELLATION OF YOUR SURGERY PATIENT SIGNATURE_________________________________  NURSE  SIGNATURE__________________________________  ________________________________________________________________________

## 2018-12-27 ENCOUNTER — Ambulatory Visit (HOSPITAL_COMMUNITY)
Admission: RE | Admit: 2018-12-27 | Discharge: 2018-12-27 | Disposition: A | Discharge status: Home / Self Care | Payer: Medicare Other | Source: Ambulatory Visit | Attending: Urology | Admitting: Urology

## 2018-12-27 ENCOUNTER — Encounter (HOSPITAL_COMMUNITY): Payer: Self-pay

## 2018-12-27 ENCOUNTER — Other Ambulatory Visit: Payer: Self-pay

## 2018-12-27 ENCOUNTER — Encounter (HOSPITAL_COMMUNITY)
Admission: RE | Admit: 2018-12-27 | Discharge: 2018-12-27 | Disposition: A | Discharge status: Home / Self Care | Payer: Medicare Other | Source: Ambulatory Visit | Attending: Urology | Admitting: Urology

## 2018-12-27 DIAGNOSIS — C61 Malignant neoplasm of prostate: Secondary | ICD-10-CM | POA: Diagnosis not present

## 2018-12-27 HISTORY — DX: Dyspnea, unspecified: R06.00

## 2018-12-27 HISTORY — DX: Type 2 diabetes mellitus without complications: E11.9

## 2018-12-27 LAB — CBC
HCT: 47.7 % (ref 39.0–52.0)
Hemoglobin: 15.1 g/dL (ref 13.0–17.0)
MCH: 28.9 pg (ref 26.0–34.0)
MCHC: 31.7 g/dL (ref 30.0–36.0)
MCV: 91.4 fL (ref 80.0–100.0)
NRBC: 0 % (ref 0.0–0.2)
PLATELETS: 203 10*3/uL (ref 150–400)
RBC: 5.22 MIL/uL (ref 4.22–5.81)
RDW: 13.2 % (ref 11.5–15.5)
WBC: 6.3 10*3/uL (ref 4.0–10.5)

## 2018-12-27 LAB — COMPREHENSIVE METABOLIC PANEL
ALBUMIN: 3.7 g/dL (ref 3.5–5.0)
ALT: 23 U/L (ref 0–44)
AST: 18 U/L (ref 15–41)
Alkaline Phosphatase: 84 U/L (ref 38–126)
Anion gap: 9 (ref 5–15)
BUN: 18 mg/dL (ref 8–23)
CHLORIDE: 103 mmol/L (ref 98–111)
CO2: 26 mmol/L (ref 22–32)
CREATININE: 1.06 mg/dL (ref 0.61–1.24)
Calcium: 8.8 mg/dL — ABNORMAL LOW (ref 8.9–10.3)
GFR calc non Af Amer: >60 mL/min (ref >60)
Glucose, Bld: 103 mg/dL — ABNORMAL HIGH (ref 70–99)
Potassium: 4.5 mmol/L (ref 3.5–5.1)
SODIUM: 138 mmol/L (ref 135–145)
Total Bilirubin: 1 mg/dL (ref 0.3–1.2)
Total Protein: 6.9 g/dL (ref 6.5–8.1)

## 2018-12-27 LAB — APTT: APTT: 30 s (ref 24–36)

## 2018-12-27 LAB — PROTIME-INR
INR: 0.96
Prothrombin Time: 12.7 seconds (ref 11.4–15.2)

## 2018-12-27 LAB — HEMOGLOBIN A1C
HEMOGLOBIN A1C: 6.2 % — AB (ref 4.8–5.6)
Mean Plasma Glucose: 131.24 mg/dL

## 2018-12-27 NOTE — Progress Notes (Addendum)
Patient seen today for preop appt for upcoming seed implant by Dr Alinda Money on 01/09/2019. Patient was seen by [facility] on preop on 12/27/2018 prior to PST appt.   Patient with hx of Type 2 Diabetes.   Patient has current SOB with exertion.   Patient states followed by Dr Pearline Cables at [place]- 08/08/2018-Care Everywhere.  LOV-08/08/2018- Dr Carlisle Cater- Care Everywhere- Cardiology - Last Pulmonology Office Visit- 08/08/2018-Care Everywhere.  01/07/2017-Stress Echo- Care Everywhere CT chest 01/18/18-Care Everywhere  08/08/18- Last EKG done at [place].  Internal Medicine- Dr Gaylord Shih- 01/11/2019- Care Everywhere. [phone] 08/09/2018- PFT-Care Everywhere  Dr Pearline Cables- [phone]  Requested by fax from office of Dr Carlisle Cater - 12 lead ekg tracing from 2018 and cath report.  With fax confirmation.

## 2019-01-04 NOTE — Progress Notes (Signed)
Anesthesia Chart Review   Case:  [id number] Date/Time:  01/09/19 0715   Procedures:      RADIOACTIVE SEED IMPLANT/BRACHYTHERAPY IMPLANT (N/A )     CYSTOSCOPY (N/A )     SPACE OAR INSTILLATION (N/A )   Anesthesia type:  General   Pre-op diagnosis:  PROSTATE CANCER   Location:  [place] / [facility]   Surgeon:  Raynelle Bring, MD      DISCUSSION:73 yo never smoker with h/o GERD, restless leg, DM II, prostate cancer scheduled for above procedure on 01/09/19 with Dr. Raynelle Bring.  He was last seen by PCP, Dr. Arie Sabina, on 12/22/17. At this visit patient stable with good control of non insulin dependent DM.   Seen by cardiology due to dyspnea on 05/11/18, Dr. Carlisle Cater, who reports pt with normal coronary arteries (cath reports has been requested) and normal LV function, no further cardiac testing indicated.  Referred to pulmonology, Dr. Theodosia Blender, see on 08/19/18.  Per her note almost-normal PSTs, she is continuing to workup.  Suspects connective tissue disease. At visit O2 99%, no respiratory distress.   Pt can proceed with planned procedure barring acute status change.  VS: BP 130/77   Pulse 90   Temp 37 C (Oral)   Resp 16   Ht 5\' 10"  (1.778 m)   Wt 130.2 kg   SpO2 99%   BMI 41.18 kg/m   PROVIDERS: Joelene Millin, MD with Internal Medicine  Carlisle Cater, MD is Cardiologist LABS: Labs reviewed: Acceptable for surgery. (all labs ordered are listed, but only abnormal results are displayed)  Labs Reviewed  COMPREHENSIVE METABOLIC PANEL - Abnormal; Notable for the following components:      Result Value   Glucose, Bld 103 (*)    Calcium 8.8 (*)    All other components within normal limits  HEMOGLOBIN A1C - Abnormal; Notable for the following components:   Hgb A1c MFr Bld 6.2 (*)    All other components within normal limits  APTT  CBC  PROTIME-INR     IMAGES: Chest Xray 12/27/18 FINDINGS: Cardiac silhouette normal in  size. Thoracic aorta mildly tortuous. Hilar and mediastinal contours otherwise unremarkable. Lungs clear. Bronchovascular markings normal. Pulmonary vascularity normal. No visible pleural effusions. No pneumothorax. Degenerative changes involving the thoracic and upper lumbar spine. No visible osseous metastasis. Mild eventration of the anterior hemidiaphragms bilaterally.  IMPRESSION: No acute cardiopulmonary disease  EKG: 12/27/2018 Rate 73 bpm Sinus rhythm with Premature atrial complexes with Abberant conduction Otherwise normal ECG  CV: Echo 01/07/2017 Stress echocardiogram. FINAL INTERPRETATION(S) 1. Negative stress echo, no evidence of ischemia. 2. Definity echo contrast used. 3. Fair exercise tolerance with no chest pain. 4. Mild left ventricular hypertrophy    Past Medical History:  Diagnosis Date  . Arthritis    hands and lower back   . Diabetes mellitus without complication ([place])    tpe 2   . Dyspnea    with exertion   . GERD (gastroesophageal reflux disease)   . Prostate cancer ([place])   . Restless leg     Past Surgical History:  Procedure Laterality Date  . COLONOSCOPY    . HERNIA REPAIR    . PROSTATE BIOPSY      MEDICATIONS: . oxybutynin (DITROPAN-XL) 10 MG 24 hr tablet  . rOPINIRole (REQUIP) 1 MG tablet  . tamsulosin (FLOMAX) 0.4 MG CAPS capsule  . TRULICITY 1.71 YO/3.7CH SOPN  . venlafaxine XR (EFFEXOR-XR) 75 MG 24 hr capsule  No current facility-administered medications for this encounter.      Maia Plan [facility] Pre-Surgical Testing [phone] 01/04/19 1:43 PM

## 2019-01-04 NOTE — Anesthesia Preprocedure Evaluation (Addendum)
Anesthesia Evaluation  Patient identified by MRN, date of birth, ID band Patient awake    Reviewed: Allergy & Precautions, NPO status , Patient's Chart, lab work & pertinent test results  Airway Mallampati: II       Dental  (+) Poor Dentition, Teeth Intact   Pulmonary    Pulmonary exam normal        Cardiovascular negative cardio ROS Normal cardiovascular exam Rhythm:Regular Rate:Normal     Neuro/Psych negative neurological ROS  negative psych ROS   GI/Hepatic GERD  Controlled,  Endo/Other  diabetes, Type 2Morbid obesity  Renal/GU      Musculoskeletal   Abdominal (+) + obese,   Peds  Hematology   Anesthesia Other Findings   Reproductive/Obstetrics                           Anesthesia Physical Anesthesia Plan  ASA: III  Anesthesia Plan: General   Post-op Pain Management:    Induction: Intravenous  PONV Risk Score and Plan: 4 or greater and Ondansetron and Dexamethasone  Airway Management Planned: LMA  Additional Equipment:   Intra-op Plan:   Post-operative Plan: Extubation in OR  Informed Consent: I have reviewed the patients History and Physical, chart, labs and discussed the procedure including the risks, benefits and alternatives for the proposed anesthesia with the patient or authorized representative who has indicated his/her understanding and acceptance.     Dental advisory given  Plan Discussed with: CRNA  Anesthesia Plan Comments: (See PST note 12/27/18, Konrad Felix, PA-C)      Anesthesia Quick Evaluation

## 2019-01-06 ENCOUNTER — Telehealth: Payer: Self-pay | Admitting: *Deleted

## 2019-01-06 NOTE — Telephone Encounter (Signed)
CALLED PATIENT TO REMIND OF PROCEDURE FOR 01-09-19, LVM FOR A RETURN CALL

## 2019-01-06 NOTE — H&P (Signed)
Office Visit Report     12/27/2018   --------------------------------------------------------------------------------   Alan Charles  MRN: [id number]  PRIMARY CARE:    DOB: 07-10-1946, 73 year old Male  REFERRING:  Derrek Monaco, M  SSN: [id number]  PROVIDER:  Raynelle Bring, M.D.    TREATING:  Azucena Fallen    LOCATION:  [facility] [phone]   --------------------------------------------------------------------------------   CC/HPI: CC: Prostate Cancer   Location of consult: [facility]   Mr. Roger has a long history of an elevated PSA that had fluctuated between 3.7 and 7.07. His outside urologist had recommended a biopsy but he declined. He initially saw me in September 2016. His PSA was 5.54 and he eventually agreed to a prostate biopsy on 11/14/15 that indicated Gleason 3+3=6 adenocarcinoma in 2 out of 12 biopsy cores. He elected active surveillance management. He underwent a prostate MRI in June 2017 that was unremarkable and a confirmatory 26 core biopsy in July 2017 demonstrated 2 out of 26 biopsy cores to be positive for Gleason 3+3=6 disease confirming his low risk disease diagnosis. In August 2019, his PSA increased to 12.10 and a surveillance biopsy on 08/25/18 demonstrated upgrade Gleason 3+4=7 adenocarcinoma with 4 out of 12 biopsy cores positive for malignancy.   He has significant LUTS that have been marginally improved on tamsulosin.   Family history: None.   Imaging studies: None.   PMH: He has a history of sleep apnea, GERD, restless leg syndrome.  PSH: Hernia repair.   TNM stage: cT1c Nx Mx  PSA: 12.10  Gleason score: 3+4=7  Biopsy (08/25/18): 4/12 cores positive  Left: L apex (10%, 3+3=6), L mid (10%, 3+4=7), L lateral mid (5%, 3+3=6)  Right: R apex (20%, 3+4=7)  Prostate volume: 44.3   Urinary function: IPSS is 12. He continues to take tamsulosin. He had previously been on  oxybutynin without significant benefit.  Erectile function: SHIM score is 5.    11/07/18: He returns today for follow up. He was recently treated for UTI by his PCP with Cipro. At the time he complained of intermittent gross hematuria, increased urgency, increased frequency, dysuria. Today, he states that lower urinary tract symptoms have improved following treatment course of antibiotic and he actually feels that his lower urinary tract symptoms have been better when compared to baseline. He denies current symptoms. He denies any changes in his overall health since being seen last. He is scheduled for cystoscopy, space OAR, and radioactive seed placement on 1/27. He denies cardiac history and he is not on blood thinners. He remains on Tamsulosin daily.   12/27/18: He returns today for preoperative appointment. He is scheduled for cystoscopy, space OAR, and radioactive seed placement on 1/27. He denies cardiac history and he is not on blood thinners. He remains on Tamsulosin daily. He denies exacerbation of voiding symptoms. No complaints of dysuria, gross hematuria, fevers, or chils. No interval episodes of cystitis.     ALLERGIES: Latex    MEDICATIONS: Ropinirole Hcl 1 mg tablet Oral  Tamsulosin Hcl 0.4 mg capsule Oral  Trulicity 1.5 RN/1.6 ml pen injector  Venlafaxine Hcl Er 75 mg capsule, ext release 24 hr Oral     GU PSH: Prostate Needle Biopsy - 08/25/2018, 07/03/2016      PSH Notes: Hernia Repair   NON-GU PSH: Hernia Repair - 2016 Surgical Pathology, Gross And Microscopic Examination For Prostate Needle - 08/25/2018, 07/03/2016    GU PMH: Urinary  Urgency - 12/29/2017 Urinary Frequency - 01/13/2017 Prostate Cancer, Prostate cancer - 2017 Hemorrhagic cystitis, Acute hemorrhagic cystitis - 2016 BPH w/LUTS, Benign prostatic hyperplasia (BPH) with straining on urination - 2016      PMH Notes:   1) Prostate cancer: He was initially seen by me in September 2016 for a 2nd opinion  regarding his elevated PSA. His baseline PSA was 3.70 in 2013 and increased spuriously to 7.07 in 2014 prompting an initial urologic referral. His PSA decreased to 4.12 but then again increased to 4.80 in October 2014. He was recommended to have a biopsy by his urologist but he declined. His PSA further increased to 5.2 in 2015 and was 6.16 (9% free) when he initially saw me in September 2016. He shortly thereafter developed clinical symptoms of prostatitis and we agreed to treat him with antibiotics and repeat his PSA which was 5.54 in November 2016. My recommendation was to proceed with a biopsy and he eventually did undergo a 12 core TRUS biopsy of the prostate on 11/14/15. This demonstrated Gleason 3+3=6 adenocarcinoma of the prostate with 2 out of 12 biopsy cores positive for malignancy. After a thorough discussion, he elected to proceed with active surveillance management.   He has no family history of prostate cancer.   Initial diagnosis: December 2016  TNM stage: cT1c Nx Mx  PSA: 5.54  Gleason score: 3+3=6  Biopsy (11/14/15): 2/12 cores positive - L lateral apex (30%, 3+3=6), R apex (20%, 3+3=6)  Prostate volume: 34.3 cc   Surveillance:  Jun 2017: MRI - negative  Jul 2017: 26 core extended biopsy - 2/26 cores positive - R apex (2/2 cores, 10%), Vol 28.5 cc   Baseline urinary function: IPSS is 17. He continues to take tamsulosin. His symptoms remain bothersome to a degree.  Baseline erectile function: SHIM score is 1.   2) BPH/LUTS: He had significant lower urinary tract symptoms at baseline including a sense of incomplete emptying, urgency, frequency, and nocturia. Baseline IPSS was 20 and he is significantly bothered by his symptoms despite alpha blocker therapy with tamsulosin.   Current treatment: Tamsulosin   Sep 2016: PVR 43, IPSS, 03 Dec 2015: Prostate volume 34.3 cc  Jan 2018: Trial of Myrbetriq (worsened symptoms)   NON-GU PMH: Restless legs syndrome, Restless leg -  2016 Arthritis Chronic fatigue, unspecified GERD Sleep Apnea    FAMILY HISTORY: Death - Father, Mother Diabetes - Runs In Family Parkinson's Disease - Runs In Family renal failure - Runs In Family   SOCIAL HISTORY: Marital Status: Married Preferred Language: English; Ethnicity: Not Hispanic Or Latino; Race: White, Asian Current Smoking Status: Patient has never smoked.   Tobacco Use Assessment Completed: Used Tobacco in last 30 days? Does not drink caffeine. Patient's occupation is/was Retired.     Notes: Married, Never a smoker, Alcohol use   REVIEW OF SYSTEMS:    GU Review Male:   Patient reports get up at night to urinate. Patient denies frequent urination, hard to postpone urination, burning/ pain with urination, leakage of urine, stream starts and stops, trouble starting your stream, have to strain to urinate , erection problems, and penile pain.  Gastrointestinal (Upper):   Patient denies nausea, vomiting, and indigestion/ heartburn.  Gastrointestinal (Lower):   Patient denies diarrhea and constipation.  Constitutional:   Patient denies fatigue, fever, weight loss, and night sweats.  Skin:   Patient denies skin rash/ lesion and itching.  Eyes:   Patient denies blurred vision and double vision.  Ears/ Nose/  Throat:   Patient denies sore throat and sinus problems.  Hematologic/Lymphatic:   Patient denies swollen glands and easy bruising.  Cardiovascular:   Patient denies leg swelling and chest pains.  Respiratory:   Patient denies cough and shortness of breath.  Endocrine:   Patient denies excessive thirst.  Musculoskeletal:   Patient denies back pain and joint pain.  Neurological:   Patient denies headaches and dizziness.  Psychologic:   Patient denies depression and anxiety.   VITAL SIGNS:      12/27/2018 10:27 AM  Weight 284.8 lb / 129.18 kg  Height 70 in / 177.8 cm  BP 134/83 mmHg  Pulse 81 /min  Temperature 98.6 F / 37 C  BMI 40.9 kg/m   MULTI-SYSTEM PHYSICAL  EXAMINATION:    Constitutional: Obese. No physical deformities. Normally developed. Good grooming.   Respiratory: Normal breath sounds. No labored breathing, no use of accessory muscles.   Cardiovascular: Regular rate and rhythm. No murmur, no gallop. Normal temperature, normal extremity pulses, no swelling, no varicosities.   Neurologic / Psychiatric: Oriented to time, oriented to place, oriented to person. No depression, no anxiety, no agitation.  Gastrointestinal: Obese abdomen. No mass, no tenderness, no rigidity. No CVAT.   Musculoskeletal: Normal gait and station of head and neck.     PAST DATA REVIEWED:  Source Of History:  Patient  Lab Test Review:   PSA  Records Review:   Previous Patient Records  Urine Test Review:   Urinalysis, Urine Culture  Urodynamics Review:   Review Bladder Scan   08/05/18 12/29/17 01/13/17 07/03/16 10/30/15 09/07/15  PSA  Total PSA 12.10 ng/mL 8.90 ng/mL 6.38 ng/dl 6.83  5.54  6.16   Free PSA     0.49  0.54   % Free PSA     9  9     PROCEDURES:          Urinalysis Dipstick Dipstick Cont'd  Color: Yellow Bilirubin: Neg mg/dL  Appearance: Clear Ketones: Neg mg/dL  Specific Gravity: 1.025 Blood: Neg ery/uL  pH: 5.5 Protein: Neg mg/dL  Glucose: Neg mg/dL Urobilinogen: 0.2 mg/dL    Nitrites: Neg    Leukocyte Esterase: Neg leu/uL    ASSESSMENT:      ICD-10 Details  1 GU:   Prostate Cancer - C61   2   Urinary Frequency - R35.0    PLAN:           Orders Labs Urine Culture          Document Letter(s):  Created for Patient: Clinical Summary         Notes:   Urinalysis is without infectious parameters or hematuria. I will send urine for culture today, however, given upcoming procedure. We reviewed his upcoming procedure in detail today and I answered all of his questions to the best of my ability. We reviewed general precautions following the procedure. He will keep scheduled follow up, as planned, for scheduled procedure. Return precuations  were reviewed in the interim.         Next Appointment:      Next Appointment: 01/09/2019 07:30 AM    Appointment Type: Surgery     Location: [facility] [phone]    Provider: Raynelle Bring, M.D.    Reason for Visit: NE/OP CYSTOSCOPY, SPACE OAR INSERTION, RADIOACTIVE SEED IMP      * Signed by Azucena Fallen on 12/27/18 at 4:42 PM (EST)*

## 2019-01-08 NOTE — Progress Notes (Signed)
  Radiation Oncology         [phone] ________________________________  Name: Alan Charles MRN: [id number]  Date: 01/08/2019  DOB: 09-Dec-1946       Prostate Seed Implant  KG:[id number], Epifanio Lesches, MD  No ref. provider found  DIAGNOSIS: 73 year-old gentleman with Stage T1c adenocarcinoma of the prostate with Gleason Score of 3+4, and PSA of 12.10  PROCEDURE: Insertion of radioactive I-125 seeds into the prostate gland.  RADIATION DOSE: 145 Gy, definitive therapy.  TECHNIQUE: Alan Charles was brought to the operating room with the urologist. He was placed in the dorsolithotomy position. He was catheterized and a rectal tube was inserted. The perineum was shaved, prepped and draped. The ultrasound probe was then introduced into the rectum to see the prostate gland.  TREATMENT DEVICE: A needle grid was attached to the ultrasound probe stand and anchor needles were placed.  3D PLANNING: The prostate was imaged in 3D using a sagittal sweep of the prostate probe. These images were transferred to the planning computer. There, the prostate, urethra and rectum were defined on each axial reconstructed image. Then, the software created an optimized 3D plan and a few seed positions were adjusted. The quality of the plan was reviewed using [facility] information for the target and the following two organs at risk:  Urethra and Rectum.  Then the accepted plan was printed and handed off to the radiation therapist.  Under my supervision, the custom loading of the seeds and spacers was carried out and loaded into sealed vicryl sleeves.  These pre-loaded needles were then placed into the needle holder.Marland Kitchen  PROSTATE VOLUME STUDY:  Using transrectal ultrasound the volume of the prostate was verified to be 41.1 cc.  SPECIAL TREATMENT PROCEDURE/SUPERVISION AND HANDLING: The pre-loaded needles were then delivered under sagittal guidance. A total of 23 needles were used to deposit 70 seeds in the prostate  gland. The individual seed activity was 0.508 mCi.  SpaceOAR:  Yes, initially tracked to the left side.  Corrected needle position and got excellent bilateral distribution behind the prostate, with perhaps some asymetry toward the left  COMPLEX SIMULATION: At the end of the procedure, an anterior radiograph of the pelvis was obtained to document seed positioning and count. Cystoscopy was performed to check the urethra and bladder.  MICRODOSIMETRY: At the end of the procedure, the patient was emitting 0.048 mR/hr at 1 meter. Accordingly, he was considered safe for hospital discharge.  PLAN: The patient will return to the radiation oncology clinic for post implant CT dosimetry in three weeks.   ________________________________  Sheral Apley Tammi Klippel, M.D.

## 2019-01-09 ENCOUNTER — Encounter (HOSPITAL_COMMUNITY): Payer: Self-pay | Admitting: *Deleted

## 2019-01-09 ENCOUNTER — Ambulatory Visit (HOSPITAL_COMMUNITY)
Admission: RE | Admit: 2019-01-09 | Discharge: 2019-01-09 | Disposition: A | Discharge status: Home / Self Care | Payer: Medicare Other | Attending: Urology | Admitting: Urology

## 2019-01-09 ENCOUNTER — Ambulatory Visit (HOSPITAL_COMMUNITY): Payer: Medicare Other | Admitting: Anesthesiology

## 2019-01-09 ENCOUNTER — Ambulatory Visit (HOSPITAL_COMMUNITY): Payer: Medicare Other | Admitting: Physician Assistant

## 2019-01-09 ENCOUNTER — Encounter (HOSPITAL_COMMUNITY)
Admission: RE | Disposition: A | Discharge status: Home / Self Care | Payer: Self-pay | Source: Home / Self Care | Attending: Urology

## 2019-01-09 ENCOUNTER — Ambulatory Visit (HOSPITAL_COMMUNITY): Payer: Medicare Other

## 2019-01-09 DIAGNOSIS — N401 Enlarged prostate with lower urinary tract symptoms: Secondary | ICD-10-CM | POA: Diagnosis not present

## 2019-01-09 DIAGNOSIS — G4730 Sleep apnea, unspecified: Secondary | ICD-10-CM | POA: Insufficient documentation

## 2019-01-09 DIAGNOSIS — E119 Type 2 diabetes mellitus without complications: Secondary | ICD-10-CM | POA: Diagnosis not present

## 2019-01-09 DIAGNOSIS — G2581 Restless legs syndrome: Secondary | ICD-10-CM | POA: Insufficient documentation

## 2019-01-09 DIAGNOSIS — R350 Frequency of micturition: Secondary | ICD-10-CM | POA: Insufficient documentation

## 2019-01-09 DIAGNOSIS — Z79899 Other long term (current) drug therapy: Secondary | ICD-10-CM | POA: Insufficient documentation

## 2019-01-09 DIAGNOSIS — Z7984 Long term (current) use of oral hypoglycemic drugs: Secondary | ICD-10-CM | POA: Diagnosis not present

## 2019-01-09 DIAGNOSIS — Z6841 Body Mass Index (BMI) 40.0 and over, adult: Secondary | ICD-10-CM | POA: Diagnosis not present

## 2019-01-09 DIAGNOSIS — C61 Malignant neoplasm of prostate: Secondary | ICD-10-CM | POA: Insufficient documentation

## 2019-01-09 HISTORY — PX: CYSTOSCOPY: SHX5120

## 2019-01-09 HISTORY — PX: SPACE OAR INSTILLATION: SHX6769

## 2019-01-09 HISTORY — PX: RADIOACTIVE SEED IMPLANT: SHX5150

## 2019-01-09 LAB — GLUCOSE, CAPILLARY
Glucose-Capillary: 108 mg/dL — ABNORMAL HIGH (ref 70–99)
Glucose-Capillary: 95 mg/dL (ref 70–99)

## 2019-01-09 SURGERY — INSERTION, RADIATION SOURCE, PROSTATE
Anesthesia: General

## 2019-01-09 MED ORDER — FENTANYL CITRATE (PF) 100 MCG/2ML IJ SOLN
INTRAMUSCULAR | Status: AC
  Filled 2019-01-09: qty 2

## 2019-01-09 MED ORDER — ACETAMINOPHEN 325 MG PO TABS: 325.0000 mg | ORAL_TABLET | ORAL | Status: DC | PRN

## 2019-01-09 MED ORDER — PROPOFOL 10 MG/ML IV BOLUS
INTRAVENOUS | Status: DC | PRN
  Administered 2019-01-09: 180 mg via INTRAVENOUS

## 2019-01-09 MED ORDER — ONDANSETRON HCL 4 MG/2ML IJ SOLN: 4.0000 mg | Freq: Once | INTRAMUSCULAR | Status: DC | PRN

## 2019-01-09 MED ORDER — FLEET ENEMA 7-19 GM/118ML RE ENEM
1.0000 | ENEMA | Freq: Once | RECTAL | Status: DC
  Filled 2019-01-09: qty 1

## 2019-01-09 MED ORDER — LACTATED RINGERS IV SOLN
INTRAVENOUS | Status: DC
  Administered 2019-01-09 (×2): via INTRAVENOUS

## 2019-01-09 MED ORDER — ACETAMINOPHEN 160 MG/5ML PO SOLN: 325.0000 mg | ORAL | Status: DC | PRN

## 2019-01-09 MED ORDER — SODIUM CHLORIDE (PF) 0.9 % IJ SOLN
INTRAMUSCULAR | Status: DC | PRN
  Administered 2019-01-09: 10 mL

## 2019-01-09 MED ORDER — FENTANYL CITRATE (PF) 100 MCG/2ML IJ SOLN
INTRAMUSCULAR | Status: DC | PRN
  Administered 2019-01-09 (×2): 50 ug via INTRAVENOUS

## 2019-01-09 MED ORDER — EPHEDRINE SULFATE-NACL 50-0.9 MG/10ML-% IV SOSY
PREFILLED_SYRINGE | INTRAVENOUS | Status: DC | PRN
  Administered 2019-01-09: 15 mg via INTRAVENOUS
  Administered 2019-01-09: 10 mg via INTRAVENOUS

## 2019-01-09 MED ORDER — HYDROCODONE-ACETAMINOPHEN 7.5-325 MG PO TABS: 1.0000 | ORAL_TABLET | Freq: Once | ORAL | Status: DC | PRN

## 2019-01-09 MED ORDER — PROPOFOL 10 MG/ML IV BOLUS
INTRAVENOUS | Status: AC
  Filled 2019-01-09: qty 20

## 2019-01-09 MED ORDER — MEPERIDINE HCL 50 MG/ML IJ SOLN: 6.2500 mg | INTRAMUSCULAR | Status: DC | PRN

## 2019-01-09 MED ORDER — DEXAMETHASONE SODIUM PHOSPHATE 10 MG/ML IJ SOLN
INTRAMUSCULAR | Status: DC | PRN
  Administered 2019-01-09: 10 mg via INTRAVENOUS

## 2019-01-09 MED ORDER — PHENYLEPHRINE 40 MCG/ML (10ML) SYRINGE FOR IV PUSH (FOR BLOOD PRESSURE SUPPORT)
PREFILLED_SYRINGE | INTRAVENOUS | Status: DC | PRN
  Administered 2019-01-09 (×4): 80 ug via INTRAVENOUS

## 2019-01-09 MED ORDER — SODIUM CHLORIDE (PF) 0.9 % IJ SOLN
INTRAMUSCULAR | Status: AC
  Filled 2019-01-09: qty 10

## 2019-01-09 MED ORDER — ONDANSETRON HCL 4 MG/2ML IJ SOLN
INTRAMUSCULAR | Status: DC | PRN
  Administered 2019-01-09: 4 mg via INTRAVENOUS

## 2019-01-09 MED ORDER — FENTANYL CITRATE (PF) 100 MCG/2ML IJ SOLN
25.0000 ug | INTRAMUSCULAR | Status: DC | PRN
  Administered 2019-01-09: 50 ug via INTRAVENOUS

## 2019-01-09 MED ORDER — DOCUSATE SODIUM 100 MG PO CAPS: 100.0000 mg | ORAL_CAPSULE | Freq: Two times a day (BID) | ORAL | 0 refills | Status: AC

## 2019-01-09 MED ORDER — IOHEXOL 300 MG/ML  SOLN
INTRAMUSCULAR | Status: DC | PRN
  Administered 2019-01-09: 7 mL

## 2019-01-09 MED ORDER — HYDROCODONE-ACETAMINOPHEN 5-325 MG PO TABS: 1.0000 | ORAL_TABLET | Freq: Four times a day (QID) | ORAL | 0 refills | Status: AC | PRN

## 2019-01-09 MED ORDER — KETOROLAC TROMETHAMINE 30 MG/ML IJ SOLN: 30.0000 mg | Freq: Once | INTRAMUSCULAR | Status: DC | PRN

## 2019-01-09 MED ORDER — EPHEDRINE 5 MG/ML INJ
INTRAVENOUS | Status: AC
  Filled 2019-01-09: qty 10

## 2019-01-09 MED ORDER — LIDOCAINE 2% (20 MG/ML) 5 ML SYRINGE
INTRAMUSCULAR | Status: DC | PRN
  Administered 2019-01-09: 60 mg via INTRAVENOUS

## 2019-01-09 MED ORDER — PHENYLEPHRINE 40 MCG/ML (10ML) SYRINGE FOR IV PUSH (FOR BLOOD PRESSURE SUPPORT)
PREFILLED_SYRINGE | INTRAVENOUS | Status: AC
  Filled 2019-01-09: qty 10

## 2019-01-09 MED ORDER — CIPROFLOXACIN IN D5W 400 MG/200ML IV SOLN
400.0000 mg | INTRAVENOUS | Status: AC
  Administered 2019-01-09: 400 mg via INTRAVENOUS
  Filled 2019-01-09: qty 200

## 2019-01-09 MED ORDER — STERILE WATER FOR IRRIGATION IR SOLN
Status: DC | PRN
  Administered 2019-01-09: 3000 mL via INTRAVESICAL

## 2019-01-09 SURGICAL SUPPLY — 34 items
BAG URINE DRAINAGE (UROLOGICAL SUPPLIES) ×3 IMPLANT
BAG URO CATCHER STRL LF (MISCELLANEOUS) ×3 IMPLANT
CATH FOLEY 2WAY SLVR  5CC 16FR (CATHETERS)
CATH FOLEY 2WAY SLVR 5CC 16FR (CATHETERS) IMPLANT
CATH FOLEY LATEX FREE 16FR (CATHETERS) ×6 IMPLANT
CATH INTERMIT  6FR 70CM (CATHETERS) ×3 IMPLANT
CATH ROBINSON RED A/P 20FR (CATHETERS) IMPLANT
CLOTH BEACON ORANGE TIMEOUT ST (SAFETY) IMPLANT
CONT SPEC 4OZ CLIKSEAL STRL BL (MISCELLANEOUS) ×3 IMPLANT
COVER BACK TABLE 60X90IN (DRAPES) ×3 IMPLANT
COVER MAYO STAND STRL (DRAPES) ×3 IMPLANT
COVER SURGICAL LIGHT HANDLE (MISCELLANEOUS) ×3 IMPLANT
COVER WAND RF STERILE (DRAPES) IMPLANT
DRSG TEGADERM 4X4.75 (GAUZE/BANDAGES/DRESSINGS) IMPLANT
DRSG TEGADERM 8X12 (GAUZE/BANDAGES/DRESSINGS) ×3 IMPLANT
GAUZE SPONGE 4X4 12PLY STRL (GAUZE/BANDAGES/DRESSINGS) ×3 IMPLANT
GLOVE BIOGEL M STRL SZ7.5 (GLOVE) IMPLANT
GLOVE SURG SS PI 7.5 STRL IVOR (GLOVE) ×3 IMPLANT
GLOVE SURG SS PI 8.0 STRL IVOR (GLOVE) ×3 IMPLANT
GOWN STRL REUS W/TWL LRG LVL3 (GOWN DISPOSABLE) ×6 IMPLANT
GUIDEWIRE STR DUAL SENSOR (WIRE) IMPLANT
HOLDER FOLEY CATH W/STRAP (MISCELLANEOUS) IMPLANT
I-Seed AgX100 iodine-125 Device ×3 IMPLANT
IMPL SPACEOAR SYSTEM 10ML (Spacer) ×1 IMPLANT
IMPLANT SPACEOAR SYSTEM 10ML (Spacer) ×3 IMPLANT
MANIFOLD NEPTUNE II (INSTRUMENTS) ×3 IMPLANT
MARKER SKIN DUAL TIP RULER LAB (MISCELLANEOUS) ×3 IMPLANT
PACK CYSTO (CUSTOM PROCEDURE TRAY) ×3 IMPLANT
SYR 10ML LL (SYRINGE) ×6 IMPLANT
TOWEL OR 17X26 10 PK STRL BLUE (TOWEL DISPOSABLE) ×3 IMPLANT
TUBING CONNECTING 10 (TUBING) IMPLANT
TUBING CONNECTING 10' (TUBING)
UNDERPAD 30X30 (UNDERPADS AND DIAPERS) ×3 IMPLANT
WATER STERILE IRR 250ML POUR (IV SOLUTION) ×3 IMPLANT

## 2019-01-09 NOTE — Anesthesia Postprocedure Evaluation (Signed)
Anesthesia Post Note  Patient: Alan Charles  Procedure(s) Performed: RADIOACTIVE SEED IMPLANT/BRACHYTHERAPY IMPLANT (N/A ) FLEXIBLE CYSTOSCOPY (N/A ) SPACE OAR INSTILLATION (N/A )     Patient location during evaluation: PACU Anesthesia Type: General Level of consciousness: awake Pain management: pain level controlled Vital Signs Assessment: post-procedure vital signs reviewed and stable Respiratory status: spontaneous breathing Cardiovascular status: stable Postop Assessment: no apparent nausea or vomiting Anesthetic complications: no    Last Vitals:  Vitals:   01/09/19 1003 01/09/19 1015  BP:  125/77  Pulse:  73  Resp:  17  Temp:    SpO2: 96% 96%    Last Pain:  Vitals:   01/09/19 1015  TempSrc:   PainSc: 0-No pain   Pain Goal:                   Huston Foley

## 2019-01-09 NOTE — Transfer of Care (Signed)
Immediate Anesthesia Transfer of Care Note  Patient: Alan Charles  Procedure(s) Performed: RADIOACTIVE SEED IMPLANT/BRACHYTHERAPY IMPLANT (N/A ) FLEXIBLE CYSTOSCOPY (N/A ) SPACE OAR INSTILLATION (N/A )  Patient Location: PACU  Anesthesia Type:General  Level of Consciousness: sedated, patient cooperative and responds to stimulation  Airway & Oxygen Therapy: Patient Spontanous Breathing and Patient connected to face mask oxygen  Post-op Assessment: Report given to RN and Post -op Vital signs reviewed and stable  Post vital signs: Reviewed and stable  Last Vitals:  Vitals Value Taken Time  BP 115/75 01/09/2019  9:13 AM  Temp    Pulse 77 01/09/2019  9:13 AM  Resp 15 01/09/2019  9:14 AM  SpO2 100 % 01/09/2019  9:13 AM  Vitals shown include unvalidated device data.  Last Pain:  Vitals:   01/09/19 0605  TempSrc: Oral         Complications: No apparent anesthesia complications

## 2019-01-09 NOTE — Op Note (Signed)
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 M.D.  Radiation oncologist: Dr. Tyler Pita  Anesthesia: General  EBL: Minimal  Complications: None  Indication: Alan Charles is a 73 y.o. gentleman with clinically localized prostate cancer. After discussing management options for treatment, he elected to proceed with radiotherapy. He presents today for the above procedures. The potential risks, complications, alternative options, and expected recovery course have been discussed in detail with the patient and he has provided informed consent to proceed.  Description of procedure: The patient was taken to the operating room and general anesthesia was induced. He was administered preoperative antibiotics, placed in the dorsal lithotomy position, and prepped and draped in the usual sterile fashion. Next, intraoperative transrectal ultrasonography was utilized for real-time intraoperative planning by the radiation oncology team. Once the treatment plan was completed and the seed strands created, stranded iodine 125 radiation seeds were placed utilizing a brachytherapy perineal template. 61 radioactive iodine 125 seeds into the prostate through 23 catheter needles.  The brachytherapy template was then removed.  A site in the midline was selected on the perineum for placement of an 18 g needle with saline.  The needle was advanced above the rectum and below Denonvillier's fascia to the mid gland and confirmed to be in the midline on transverse imaging.  One cc of saline was injected confirming appropriate expansion of this space.  A total of 5 cc of saline was then injected to open the  space further bilaterally.  The saline syringe was then removed and the SpaceOAR hydrogel was injected with good distribution bilaterally. Position of the radiation seeds was confirmed on fluoroscopic imaging.  Flexible cystoscopy was then performed and no seeds were identified within the bladder.  No bladder tumors, stones, or other mucosal pathology was identified within the bladder. He tolerated the procedure well and without complications. He was able to be transferred to the recovery unit in satisfactory condition.  He was given a voiding trial in the PACU.

## 2019-01-09 NOTE — Anesthesia Procedure Notes (Signed)
Procedure Name: LMA Insertion Performed by: Fatma Rutten J, CRNA Pre-anesthesia Checklist: Patient identified, Emergency Drugs available, Suction available, Patient being monitored and Timeout performed Patient Re-evaluated:Patient Re-evaluated prior to induction Oxygen Delivery Method: Circle system utilized Preoxygenation: Pre-oxygenation with 100% oxygen Induction Type: IV induction Ventilation: Mask ventilation without difficulty LMA: LMA inserted LMA Size: 4.0 Number of attempts: 1 Placement Confirmation: positive ETCO2 and breath sounds checked- equal and bilateral Tube secured with: Tape Dental Injury: Teeth and Oropharynx as per pre-operative assessment        

## 2019-01-09 NOTE — Discharge Instructions (Addendum)
° °  You will be prescribed tamsulosin which is a medication to help you urinate over the next month (you already are taking this).  You should call Dr. Lynne Logan office [phone]) if you feel you cannot empty your bladder well. Also, call if you develop fever > 101.  You will also be prescribed pain medication, a stool softener, and an antibiotic to take initially after the procedure.  Followup with Dr. Alinda Money and your radiation oncologist as scheduled.

## 2019-01-09 NOTE — Interval H&P Note (Signed)
History and Physical Interval Note:  01/09/2019 7:10 AM  Alan Charles  has presented today for surgery, with the diagnosis of PROSTATE CANCER  The various methods of treatment have been discussed with the patient and family. After consideration of risks, benefits and other options for treatment, the patient has consented to  Procedure(s): RADIOACTIVE SEED IMPLANT/BRACHYTHERAPY IMPLANT (N/A) CYSTOSCOPY (N/A) SPACE OAR INSTILLATION (N/A) as a surgical intervention .  The patient's history has been reviewed, patient examined, no change in status, stable for surgery.  I have reviewed the patient's chart and labs.  Questions were answered to the patient's satisfaction.     [employer]

## 2019-01-11 ENCOUNTER — Encounter (HOSPITAL_COMMUNITY): Payer: Self-pay | Admitting: Urology

## 2019-01-17 NOTE — Progress Notes (Signed)
Alan Charles is here today for a follow-up appointment.  Burning-Patient states that he had some burning the first day after treatment but not any since then.  Blood-Hematuria-Patient states that he had some blood in his urine the first day after treatment but not any since the.  Leakage-Patient states that he only has leakage if he has some urgency and can't make it to the bathroom.  IPSS Score-15  Foley denies   Urologist Appt.-February 18,2020  MRI Scheduled-(01/26/2019)

## 2019-01-25 ENCOUNTER — Telehealth: Payer: Self-pay | Admitting: *Deleted

## 2019-01-25 NOTE — Telephone Encounter (Signed)
Called patient to remind of post seed scan and MRI for 01-26-19, spoke with patient and he is aware of these appts.

## 2019-01-26 ENCOUNTER — Encounter: Payer: Self-pay | Admitting: Medical Oncology

## 2019-01-26 ENCOUNTER — Encounter: Payer: Self-pay | Admitting: Urology

## 2019-01-26 ENCOUNTER — Ambulatory Visit
Admission: RE | Admit: 2019-01-26 | Discharge: 2019-01-26 | Disposition: A | Discharge status: Home / Self Care | Payer: Medicare Other | Source: Ambulatory Visit | Attending: Urology | Admitting: Urology

## 2019-01-26 ENCOUNTER — Other Ambulatory Visit: Payer: Self-pay

## 2019-01-26 ENCOUNTER — Ambulatory Visit (HOSPITAL_COMMUNITY)
Admission: RE | Admit: 2019-01-26 | Discharge: 2019-01-26 | Disposition: A | Discharge status: Home / Self Care | Payer: Medicare Other | Source: Ambulatory Visit | Attending: Urology | Admitting: Urology

## 2019-01-26 ENCOUNTER — Ambulatory Visit
Admission: RE | Admit: 2019-01-26 | Discharge: 2019-01-26 | Disposition: A | Discharge status: Home / Self Care | Payer: Medicare Other | Source: Ambulatory Visit | Attending: Radiation Oncology | Admitting: Radiation Oncology

## 2019-01-26 VITALS — BP 128/65 | HR 83 | Temp 99.3°F | Resp 20 | Ht 70.0 in | Wt 296.0 lb

## 2019-01-26 DIAGNOSIS — C61 Malignant neoplasm of prostate: Secondary | ICD-10-CM | POA: Diagnosis present

## 2019-01-26 DIAGNOSIS — Z79899 Other long term (current) drug therapy: Secondary | ICD-10-CM | POA: Diagnosis not present

## 2019-01-26 DIAGNOSIS — Z923 Personal history of irradiation: Secondary | ICD-10-CM | POA: Diagnosis not present

## 2019-01-26 NOTE — Progress Notes (Signed)
Radiation Oncology         [phone]) [phone] ________________________________  Name: Alan Charles MRN: [id number]  Date: 01/26/2019  DOB: 1946/01/29  Post-Seed Follow-Up Visit Note  CC: Joelene Millin, MD  Raynelle Bring, MD  Diagnosis:   73 year-old gentleman with Stage T1c adenocarcinoma of the prostate with Gleason Score of 3+4, and PSA of 12.10    ICD-10-CM   1. Malignant neoplasm of prostate ([place]) C61     Interval Since Last Radiation:  2.5 weeks 01/09/19:  Insertion of radioactive I-125 seeds into the prostate gland; 145 Gy, definitive therapy with placement of SpaceOAR gel.  Narrative:  The patient returns today for routine follow-up.  He is complaining of increased urinary frequency, urgency and weak stream but reports that this is currently close to back to his baseline.  He has continued taking Flomax daily.  He filled out a questionnaire regarding urinary function today providing and overall IPSS score of 15 characterizing his symptoms as moderate.  His pre-implant score was 12.  He specifically denies gross hematuria, dysuria, suprapubic discomfort, excessive daytime frequency, intermittency, straining or incomplete bladder emptying.  He denies abdominal pain, nausea, vomiting, diarrhea or constipation.  He reports a healthy appetite and is maintaining his weight.  He has noticed a gradual return in his energy level.  Overall, he is quite pleased with his progress to date.  ALLERGIES:  is allergic to latex.  Meds: Current Outpatient Medications  Medication Sig Dispense Refill  . oxybutynin (DITROPAN-XL) 10 MG 24 hr tablet Take 10 mg by mouth at bedtime.  8  . rOPINIRole (REQUIP) 1 MG tablet Take 4 mg by mouth every evening.     . tamsulosin (FLOMAX) 0.4 MG CAPS capsule Take 0.4 mg by mouth at bedtime.     . TRULICITY 1.5 BM/8.4XL SOPN Inject 1.5 mg into the skin every Friday.   10  . venlafaxine XR (EFFEXOR-XR) 75 MG 24 hr capsule Take 75-150 mg by mouth See  admin instructions. Take 150 mg by mouth in the morning and take 75 mg by mouth at bedtime  0  . docusate sodium (COLACE) 100 MG capsule Take 1 capsule (100 mg total) by mouth 2 (two) times daily. (Patient not taking: Reported on 01/26/2019) 30 capsule 0  . HYDROcodone-acetaminophen (NORCO/VICODIN) 5-325 MG tablet Take 1-2 tablets by mouth every 6 (six) hours as needed. (Patient not taking: Reported on 01/26/2019) 8 tablet 0   No current facility-administered medications for this encounter.     Physical Findings: In general this is a well appearing Caucasian male in no acute distress.  He's alert and oriented x4 and appropriate throughout the examination. Cardiopulmonary assessment is negative for acute distress and he exhibits normal effort.   Lab Findings: Lab Results  Component Value Date   WBC 6.3 12/27/2018   HGB 15.1 12/27/2018   HCT 47.7 12/27/2018   MCV 91.4 12/27/2018   PLT 203 12/27/2018    Radiographic Findings:  Patient underwent CT imaging in our clinic for post implant dosimetry. The CT will be reviewed by Dr. Tammi Klippel to ensure an adequate distribution of radioactive seeds throughout the prostate gland and confirm that there are no seeds in or near the rectum.  He is scheduled for MRI pelvis later this afternoon and those images will be fused with his CT images for further evaluation.  We suspect the final radiation plan and dosimetry will show appropriate coverage of the prostate gland.  He understands that we will call  him with any unanticipated findings on further review.  Impression/Plan: 73 year-old gentleman with Stage T1c adenocarcinoma of the prostate with Gleason Score of 3+4, and PSA of 12.10 The patient is recovering from the effects of radiation. His urinary symptoms should gradually improve over the next 4-6 months. We talked about this today. He is encouraged by his improvement already and is otherwise pleased with his outcome. We also talked about long-term  follow-up for prostate cancer following seed implant. He understands that ongoing PSA determinations and digital rectal exams will help perform surveillance to rule out disease recurrence. He has a follow up appointment scheduled with Dr. Alinda Money on 01/31/2019. He understands what to expect with his PSA measures. Patient was also educated today about some of the long-term effects from radiation including a small risk for rectal bleeding and possibly erectile dysfunction. We talked about some of the general management approaches to these potential complications. However, I did encourage the patient to contact our office or return at any point if he has questions or concerns related to his previous radiation and prostate cancer.    Nicholos Johns, PA-C

## 2019-01-26 NOTE — Progress Notes (Signed)
Alan Charles states the brachytherapy procedure went well and he is doing well. He experienced minimal side effects post procedure. He reports hematuria and burning post op day #1 but nothing since. He states he is very happy with his treatment choice. He has MRI scheduled for 3 pm this afternoon but after a call to MRI they are willing to work him in early.  I wished him well and asked him and his wife to call if I can be of assistance in the future. They voiced appreciation of care and the above.

## 2019-01-27 NOTE — Progress Notes (Signed)
  Radiation Oncology         [phone] ________________________________  Name: Alan Charles MRN: [id number]  Date: 01/26/2019  DOB: 1946/06/23  COMPLEX SIMULATION NOTE  NARRATIVE:  The patient was brought to the [facility] today following prostate seed implantation approximately one month ago.  Identity was confirmed.  All relevant records and images related to the planned course of therapy were reviewed.  Then, the patient was set-up supine.  CT images were obtained.  The CT images were loaded into the planning software.  Then the prostate and rectum were contoured.  Treatment planning then occurred.  The implanted iodine 125 seeds were identified by the physics staff for projection of radiation distribution  I have requested : 3D Simulation  I have requested a DVH of the following structures: Prostate and rectum.    ________________________________  Sheral Apley Tammi Klippel, M.D.

## 2019-02-02 ENCOUNTER — Encounter: Payer: Self-pay | Admitting: Radiation Oncology

## 2019-02-02 DIAGNOSIS — C61 Malignant neoplasm of prostate: Secondary | ICD-10-CM | POA: Diagnosis not present

## 2019-02-07 NOTE — Progress Notes (Signed)
  Radiation Oncology         [phone]) [phone] ________________________________  Name: Alan Charles MRN: [id number]  Date: 02/02/2019  DOB: 1946-08-31  3D Planning Note   Prostate Brachytherapy Post-Implant Dosimetry  Diagnosis: 73 year-old gentleman with Stage T1c adenocarcinoma of the prostate with Gleason Score of 3+4, and PSA of 12.10.  Narrative: On a previous date, Alan Charles returned following prostate seed implantation for post implant planning. He underwent CT scan complex simulation to delineate the three-dimensional structures of the pelvis and demonstrate the radiation distribution.  Since that time, the seed localization, and complex isodose planning with dose volume histograms have now been completed.  Results:   Prostate Coverage - The dose of radiation delivered to the 90% or more of the prostate gland (D90) was 108.02% of the prescription dose. This exceeds our goal of greater than 90%. Rectal Sparing - The volume of rectal tissue receiving the prescription dose or higher was 0.04 cc. This falls under our thresholds tolerance of 1.0 cc.  Impression: The prostate seed implant appears to show adequate target coverage and appropriate rectal sparing.  Plan:  The patient will continue to follow with urology for ongoing PSA determinations. I would anticipate a high likelihood for local tumor control with minimal risk for rectal morbidity.  ________________________________  Sheral Apley Tammi Klippel, M.D.
